# Patient Record
Sex: Male | Born: 1975
Health system: Southern US, Community
[De-identification: ages and names within clinical notes are randomized; demographics above are authoritative.]

## PROBLEM LIST (undated history)

## (undated) DIAGNOSIS — A6000 Herpesviral infection of urogenital system, unspecified: Secondary | ICD-10-CM

## (undated) DIAGNOSIS — I472 Ventricular tachycardia, unspecified: Secondary | ICD-10-CM

## (undated) DIAGNOSIS — I998 Other disorder of circulatory system: Secondary | ICD-10-CM

## (undated) DIAGNOSIS — I499 Cardiac arrhythmia, unspecified: Secondary | ICD-10-CM

## (undated) DIAGNOSIS — R55 Syncope and collapse: Secondary | ICD-10-CM

## (undated) DIAGNOSIS — B349 Viral infection, unspecified: Secondary | ICD-10-CM

## (undated) DIAGNOSIS — R12 Heartburn: Secondary | ICD-10-CM

## (undated) DIAGNOSIS — R000 Tachycardia, unspecified: Secondary | ICD-10-CM

## (undated) DIAGNOSIS — K219 Gastro-esophageal reflux disease without esophagitis: Secondary | ICD-10-CM

## (undated) DIAGNOSIS — R002 Palpitations: Secondary | ICD-10-CM

## (undated) DIAGNOSIS — I493 Ventricular premature depolarization: Secondary | ICD-10-CM

## (undated) DIAGNOSIS — J309 Allergic rhinitis, unspecified: Secondary | ICD-10-CM

## (undated) DIAGNOSIS — R001 Bradycardia, unspecified: Secondary | ICD-10-CM

## (undated) DIAGNOSIS — F419 Anxiety disorder, unspecified: Secondary | ICD-10-CM

## (undated) DIAGNOSIS — F329 Major depressive disorder, single episode, unspecified: Secondary | ICD-10-CM

## (undated) DIAGNOSIS — M549 Dorsalgia, unspecified: Secondary | ICD-10-CM

## (undated) DIAGNOSIS — R072 Precordial pain: Secondary | ICD-10-CM

## (undated) DIAGNOSIS — F3289 Other specified depressive episodes: Secondary | ICD-10-CM

## (undated) HISTORY — DX: Herpesviral infection of urogenital system, unspecified: A60.00

## (undated) HISTORY — DX: Allergic rhinitis, unspecified: J30.9

## (undated) HISTORY — DX: Anxiety disorder, unspecified: F41.9

## (undated) HISTORY — DX: Other disorder of circulatory system: I99.8

## (undated) HISTORY — DX: Cardiac arrhythmia, unspecified: I49.9

## (undated) HISTORY — DX: Ventricular tachycardia, unspecified: I47.20

## (undated) HISTORY — DX: Bradycardia, unspecified: R00.1

## (undated) HISTORY — DX: Heartburn: R12

## (undated) HISTORY — DX: Syncope and collapse: R55

## (undated) HISTORY — DX: Dorsalgia, unspecified: M54.9

## (undated) HISTORY — DX: Ventricular premature depolarization: I49.3

## (undated) HISTORY — DX: Palpitations: R00.2

## (undated) HISTORY — PX: SHOULDER SURGERY: SHX246

## (undated) HISTORY — DX: Other specified depressive episodes: F32.89

## (undated) HISTORY — DX: Major depressive disorder, single episode, unspecified: F32.9

## (undated) HISTORY — DX: Viral infection, unspecified: B34.9

## (undated) HISTORY — DX: Tachycardia, unspecified: R00.0

## (undated) HISTORY — DX: Gastro-esophageal reflux disease without esophagitis: K21.9

## (undated) HISTORY — DX: Precordial pain: R07.2

## (undated) HISTORY — DX: Ventricular tachycardia: I47.2

---

## 2002-09-02 ENCOUNTER — Encounter: Admission: RE | Admit: 2002-09-02 | Discharge: 2002-12-01 | Payer: Self-pay | Admitting: *Deleted

## 2003-01-06 ENCOUNTER — Encounter: Admission: RE | Admit: 2003-01-06 | Discharge: 2003-01-06 | Payer: Self-pay | Admitting: Specialist

## 2003-01-06 ENCOUNTER — Encounter: Payer: Self-pay | Admitting: Specialist

## 2003-01-06 ENCOUNTER — Encounter: Payer: Self-pay | Admitting: Diagnostic Radiology

## 2004-06-13 ENCOUNTER — Encounter: Admission: RE | Admit: 2004-06-13 | Discharge: 2004-06-13 | Payer: Self-pay | Admitting: Family Medicine

## 2007-01-06 ENCOUNTER — Encounter: Admission: RE | Admit: 2007-01-06 | Discharge: 2007-01-06 | Payer: Self-pay | Admitting: Family Medicine

## 2008-04-12 ENCOUNTER — Emergency Department (HOSPITAL_COMMUNITY): Admission: EM | Admit: 2008-04-12 | Discharge: 2008-04-12 | Payer: Self-pay | Admitting: Emergency Medicine

## 2011-02-15 LAB — DIFFERENTIAL
Basophils Absolute: 0 10*3/uL (ref 0.0–0.1)
Lymphocytes Relative: 21 % (ref 12–46)
Lymphs Abs: 1.6 10*3/uL (ref 0.7–4.0)
Neutrophils Relative %: 71 % (ref 43–77)

## 2011-02-15 LAB — URINALYSIS, ROUTINE W REFLEX MICROSCOPIC
Glucose, UA: NEGATIVE mg/dL
Nitrite: NEGATIVE
Protein, ur: NEGATIVE mg/dL
pH: 6 (ref 5.0–8.0)

## 2011-02-15 LAB — COMPREHENSIVE METABOLIC PANEL
BUN: 18 mg/dL (ref 6–23)
CO2: 28 mEq/L (ref 19–32)
Calcium: 9.5 mg/dL (ref 8.4–10.5)
Creatinine, Ser: 1.06 mg/dL (ref 0.4–1.5)
GFR calc non Af Amer: >60 mL/min (ref >60)
Glucose, Bld: 97 mg/dL (ref 70–99)
Total Bilirubin: 0.6 mg/dL (ref 0.3–1.2)

## 2011-02-15 LAB — CBC
HCT: 47.5 % (ref 39.0–52.0)
Hemoglobin: 16.1 g/dL (ref 13.0–17.0)
MCHC: 34 g/dL (ref 30.0–36.0)
MCV: 84.8 fL (ref 78.0–100.0)
RBC: 5.61 MIL/uL (ref 4.22–5.81)

## 2012-04-20 ENCOUNTER — Encounter: Payer: Self-pay | Admitting: Internal Medicine

## 2012-04-22 ENCOUNTER — Encounter: Payer: Self-pay | Admitting: Internal Medicine

## 2012-06-08 ENCOUNTER — Encounter: Payer: Self-pay | Admitting: Internal Medicine

## 2012-06-08 ENCOUNTER — Ambulatory Visit (INDEPENDENT_AMBULATORY_CARE_PROVIDER_SITE_OTHER): Payer: BC Managed Care – PPO | Admitting: Internal Medicine

## 2012-06-08 VITALS — BP 138/90 | HR 82 | Ht 73.0 in | Wt 205.0 lb

## 2012-06-08 DIAGNOSIS — R55 Syncope and collapse: Secondary | ICD-10-CM

## 2012-06-08 NOTE — Patient Instructions (Signed)
Your physician recommends that you schedule a follow-up appointment as needed  

## 2012-06-08 NOTE — Progress Notes (Signed)
Primary Care Physician: Deboraha Sprang Referring Physician:  Dr Lucky Rathke is a 37 y.o. male who presents today for EP consultation regarding syncope.  He has had several episodes of prior syncope.  These typically occur when upright and with low level exercise.  His first episode occurred in 2009.  He reports that he was [place] brisk[place] ferry.  He had abrupt syncope for only several seconds and then quickly returned to baseline. He reports that he has had "a dozen" similar episodes.  He does not always have full syncope with these events.  The episodes typically last 15-20 seconds and are proceeded by a prodrome of chest tightness with a slow "pounding" of his heart.  He denies diaphoresis, palor, tachypalpitations, or other symptoms.  He recovers and immediatly returns to normal after the episode.  Today, he denies symptoms of palpitations, chest pain, shortness of breath, orthopnea, PND, lower extremity edema, or neurologic sequela. The patient is tolerating medications without difficulties and is otherwise without complaint today.   Past Medical History  Diagnosis Date  . Anxiety   . Palpitations   . Syncope   . Back pain   . Genital HSV    Past Surgical History  Procedure Date  . Shoulder surgery     right    Current Outpatient Prescriptions  Medication Sig Dispense Refill  . ALPRAZolam (XANAX) 0.5 MG tablet Take 0.5 mg by mouth at bedtime as needed.      Marland Kitchen escitalopram (LEXAPRO) 20 MG tablet       . finasteride (PROPECIA) 1 MG tablet Take 1 mg by mouth daily.      . Omeprazole Magnesium (PRILOSEC OTC PO) Take by mouth.      Marland Kitchen PHENTERMINE HCL PO Take by mouth.      . valACYclovir (VALTREX) 500 MG tablet         No Known Allergies  History   Social History  . Marital Status: Married    Spouse Name: N/A    Number of Children: N/A  . Years of Education: N/A   Occupational History  . Not on file.   Social History Main Topics  . Smoking status: Never  Smoker   . Smokeless tobacco: Not on file  . Alcohol Use: Yes     Comment: 1-2 mixed drinks per day  . Drug Use: No  . Sexually Active: Not on file   Other Topics Concern  . Not on file   Social History Narrative   Lives in [place].  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 CAD      paternal uncle died suddenly, per pt autopsy confirmed MI    ROS- All systems are reviewed and negative except as per the HPI above  Physical Exam: Filed Vitals:   06/08/12 1525  BP: 138/90  Pulse: 82  Height: 6\' 1"  (1.854 m)  Weight: 205 lb (92.987 kg)    GEN- The patient is well appearing, alert and oriented x 3 today.   Head- normocephalic, atraumatic Eyes-  Sclera clear, conjunctiva pink Ears- hearing intact Oropharynx- clear Neck- supple, no JVP Lymph- no cervical lymphadenopathy Lungs- Clear to ausculation bilaterally, normal work of breathing Heart- Regular rate and rhythm, no murmurs, rubs or gallops, PMI not laterally displaced GI- soft, NT, ND, + BS Extremities- no clubbing, cyanosis, or edema MS- no significant deformity or atrophy Skin- no rash or lesion  Psych- euthymic mood, full affect Neuro- strength and sensation are intact  EKG 04/24/12- sinus rhythm 76 bpm, PR 198, QRS 96, Qtc 384, mild peaking of t waves, otherwise normal ekg Echo 04/29/12 reviewed holter and event monitor are reviewed  Assessment and Plan:

## 2012-06-21 DIAGNOSIS — R55 Syncope and collapse: Secondary | ICD-10-CM | POA: Insufficient documentation

## 2012-06-21 NOTE — Assessment & Plan Note (Signed)
The patient has had recurrent episodes of syncope.  He has a low risk ekg and echo and denies FH of sudden death.  He had nonsustained VT captured on a holter monitor during which he was asymptomatic.  He then later had presyncopal symptoms consistent with his prior episodes while wearing an event monitor.  This captured abrupt sinus bradycardia.  The monitor was otherwise unrevealing except for occasional PVCs not closely coupled to the preceding t wave.  No further NSVT was observed while wearing his monitor. I think that his syncopal events are likely neurocardiogenic in etiology.  Cardioinhibitory response was captured on event monitor.  He likely has vasomotor effects with these episodes also.  At this point, I would recommend conservative lifestyle modification including avoidance of triggers and assuming a recumbent position should symptoms occur. If episodes worsen then we could consider implantable loop recorder placement or if further episodes of bradycardia present possibly even a pacemaker.  He is clear that he would like to avoid further procedures if possible and would prefer a "watchful waiting" approach.  He will follow with Dr [name] and I will see as needed going forward.

## 2012-06-24 ENCOUNTER — Institutional Professional Consult (permissible substitution): Payer: BC Managed Care – PPO | Admitting: Internal Medicine

## 2012-06-30 ENCOUNTER — Encounter: Payer: Self-pay | Admitting: Internal Medicine

## 2012-09-14 ENCOUNTER — Encounter: Payer: Self-pay | Admitting: Internal Medicine

## 2013-04-22 ENCOUNTER — Other Ambulatory Visit: Payer: Self-pay | Admitting: Gastroenterology

## 2013-04-22 DIAGNOSIS — R1011 Right upper quadrant pain: Secondary | ICD-10-CM

## 2013-04-22 DIAGNOSIS — R072 Precordial pain: Secondary | ICD-10-CM

## 2013-04-22 HISTORY — DX: Precordial pain: R07.2

## 2013-04-26 ENCOUNTER — Ambulatory Visit
Admission: RE | Admit: 2013-04-26 | Discharge: 2013-04-26 | Disposition: A | Discharge status: Home / Self Care | Payer: BC Managed Care – PPO | Source: Ambulatory Visit | Attending: Gastroenterology | Admitting: Gastroenterology

## 2013-04-26 DIAGNOSIS — R1011 Right upper quadrant pain: Secondary | ICD-10-CM

## 2013-04-27 ENCOUNTER — Encounter: Payer: Self-pay | Admitting: Internal Medicine

## 2013-04-28 ENCOUNTER — Ambulatory Visit (INDEPENDENT_AMBULATORY_CARE_PROVIDER_SITE_OTHER): Payer: BC Managed Care – PPO | Admitting: Internal Medicine

## 2013-04-28 ENCOUNTER — Encounter: Payer: Self-pay | Admitting: Internal Medicine

## 2013-04-28 VITALS — BP 128/82 | HR 78 | Ht 73.0 in | Wt 214.0 lb

## 2013-04-28 DIAGNOSIS — I4729 Other ventricular tachycardia: Secondary | ICD-10-CM

## 2013-04-28 DIAGNOSIS — I472 Ventricular tachycardia, unspecified: Secondary | ICD-10-CM

## 2013-04-28 DIAGNOSIS — R55 Syncope and collapse: Secondary | ICD-10-CM

## 2013-04-28 NOTE — Patient Instructions (Signed)
Your physician recommends that you schedule a follow-up appointment in: 2 weeks with Dr [name]  Your physician has requested that you have a cardiac MRI. Cardiac MRI uses a computer to create images of your heart as its beating, producing both still and moving pictures of your heart and major blood vessels. For further information please visit [email or web address]. Please follow the instruction sheet given to you today for more information.  Your physician has requested that you have an echocardiogram. Echocardiography is a painless test that uses sound waves to create images of your heart. It provides your doctor with information about the size and shape of your heart and how well your heart's chambers and valves are working. This procedure takes approximately one hour. There are no restrictions for this procedure.  NO Driving

## 2013-04-29 NOTE — Progress Notes (Signed)
PCP:  Deboraha Sprang Primary Cardiologist: Pernell Lenoir is a 37 y.o. male who presents today for EP follow-up regarding syncope.  He has continued to have episodes of syncope and pre-syncope associated with light levels of exertion.  His father has recently been diagnosed with HCM after having a VT arrest and subsequently had an ICD implanted.   Last echocardiogram in Dec 2013 demonstrated an EF of 65-70%, no RWMA, IVSD of 1.4.  He has previously worn a holter monitor which demonstrated PVC's, NSVT, as well as abrupt episodes of bradycardia (likely neurocardiogenic).  The patient presents today for routine electrophysiology followup.  Since last being seen in our clinic, the patient reports doing very well.  Today, he denies symptoms of palpitations, chest pain, shortness of breath, orthopnea, PND, lower extremity edema, or neurologic sequela.  The patient feels that he is tolerating medications without difficulties and is otherwise without complaint today.   Past Medical History  Diagnosis Date  . Anxiety   . Palpitations   . Syncope   . Back pain     without radiation  . Genital HSV   . Tachycardia, unspecified   . Syncope and collapse   . Depressive disorder, not elsewhere classified   . Other specified circulatory system disorders   . Allergic rhinitis, cause unspecified   . Viral syndrome   . Abnormal heart rhythm   . Heartburn     Long Hx  . Substernal chest pain 04/22/13    Pt reports having vague symptoms of substernal CP, different from heartburn in that it is dull & radiatesto the back. Occurring at least 3 episodes over past several weeks. Pain free in between episodes. (04/22/13 Dr. Carman Ching GI)   . Esophageal reflux     Chronic but his current pain appears to be different than his radiating to the back. Possible biliary disease. (04/22/13 Dr. Carman Ching, GI)  . Wide QRS ventricular tachycardia     Noted on event monitor (04/20/12-05/20/12) at night  .  Bradycardia     Noted on event monitor (04/20/12-05/20/12) Just before bradycardia was recorded, HR was approx. 130bpm  . Near syncope     While wearing event monitor (04/20/12-05/20/12) 46bpm. Also had chest tightness at this time. Possible vagal ([place] 05/22/12)  . PVC (premature ventricular contraction)     Noted on event monitor (04/20/12-05/20/12)    Past Surgical History  Procedure Laterality Date  . Shoulder surgery Right     Dislocated    Current Outpatient Prescriptions  Medication Sig Dispense Refill  . ALPRAZolam (XANAX) 0.5 MG tablet Take 0.5 mg by mouth as needed.       Marland Kitchen escitalopram (LEXAPRO) 20 MG tablet Take 20 mg by mouth daily.       . Omeprazole Magnesium (PRILOSEC OTC PO) Take 1 tablet by mouth 2 (two) times daily.       . valACYclovir (VALTREX) 500 MG tablet Take 500 mg by mouth daily.        No current facility-administered medications for this visit.    No Known Allergies  History   Social History  . Marital Status: Married    Spouse Name: N/A    Number of Children: N/A  . Years of Education: N/A   Occupational History  . Not on file.   Social History Main Topics  . Smoking status: Never Smoker   . Smokeless tobacco: Not on file  . Alcohol Use: Yes     Comment: 1-2 mixed drinks  per day  . Drug Use: No  . Sexual Activity: Not on file   Other Topics Concern  . Not on file   Social History Narrative   Lives in [place].  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 CAD      paternal uncle died suddenly, per pt autopsy confirmed MI  . Breast cancer Mother 57  . Hiatal hernia Father   . Cardiomyopathy Father     Hypertrophic cardiomyopathy HOCM  . Anxiety disorder Sister   . Heart disease Paternal Uncle     early 39s  . Cardiomyopathy Paternal Uncle     Hypertrophic cardiomyopathy HOCM  . Diabetes Paternal Grandmother   . Diabetes Paternal Grandfather   . Cardiomyopathy Paternal  Grandfather     Hypertrophic cardiomyopathy HOCM    ROS-  All systems are reviewed and are negative except as outlined in the HPI above  Physical Exam: Filed Vitals:   04/28/13 1640  BP: 128/82  Pulse: 78  Height: 6\' 1"  (1.854 m)  Weight: 214 lb (97.07 kg)    GEN- The patient is well appearing, alert and oriented x 3 today.   Head- normocephalic, atraumatic Eyes-  Sclera clear, conjunctiva pink Ears- hearing intact Oropharynx- clear Neck- supple, no JVP Lymph- no cervical lymphadenopathy Lungs- Clear to ausculation bilaterally, normal work of breathing Heart- Regular rate and rhythm, I/VI SEM LUSB which increases to 3/6 with valsalva GI- soft, NT, ND, + BS Extremities- no clubbing, cyanosis, or edema MS- no significant deformity or atrophy Skin- no rash or lesion Psych- euthymic mood, full affect Neuro- strength and sensation are intact  EKG today is reviewed and reveals sinus rhythm with left posterior fascicular block pattern, nonspecific ST/T changes  Assessment and Plan:  1. Syncope Unclear etiology, though at times vagal by history, not at other times I have reminded him of DMVs restriction on driving x 6 months following syncope. I think that given his family history of sudden death and his father's diagnosis of HCM with VT, a more thorough look at this patient is advised. I will repeat an echo at this time as well as a cardiac MRI to evaluate for HCM.  His father has planned to proceed with genetic testing and we will offer this to [place] also if his father is found to have a mutation.  2. Murmur Concerning for dynamic outflow tract obstruction Will perform valsalva during echo Cardiac MRI to evaluate for HCM Genetic testing as above  Consider ILR placement if above workup is negative Return in 2 weeks for additional discussion

## 2013-05-07 ENCOUNTER — Encounter: Payer: Self-pay | Admitting: Internal Medicine

## 2013-05-10 ENCOUNTER — Other Ambulatory Visit (HOSPITAL_COMMUNITY): Payer: BC Managed Care – PPO

## 2013-05-12 ENCOUNTER — Encounter: Payer: Self-pay | Admitting: Internal Medicine

## 2013-05-12 ENCOUNTER — Ambulatory Visit: Payer: BC Managed Care – PPO | Admitting: Internal Medicine

## 2013-05-27 ENCOUNTER — Encounter (HOSPITAL_COMMUNITY): Payer: Self-pay | Admitting: Emergency Medicine

## 2013-05-27 ENCOUNTER — Other Ambulatory Visit (HOSPITAL_COMMUNITY): Payer: BC Managed Care – PPO

## 2013-05-27 ENCOUNTER — Emergency Department (HOSPITAL_COMMUNITY)
Admission: EM | Admit: 2013-05-27 | Discharge: 2013-05-27 | Disposition: A | Discharge status: Home / Self Care | Payer: BC Managed Care – PPO | Attending: Emergency Medicine | Admitting: Emergency Medicine

## 2013-05-27 DIAGNOSIS — Z8619 Personal history of other infectious and parasitic diseases: Secondary | ICD-10-CM | POA: Insufficient documentation

## 2013-05-27 DIAGNOSIS — F3289 Other specified depressive episodes: Secondary | ICD-10-CM | POA: Insufficient documentation

## 2013-05-27 DIAGNOSIS — K219 Gastro-esophageal reflux disease without esophagitis: Secondary | ICD-10-CM | POA: Insufficient documentation

## 2013-05-27 DIAGNOSIS — K5289 Other specified noninfective gastroenteritis and colitis: Secondary | ICD-10-CM | POA: Insufficient documentation

## 2013-05-27 DIAGNOSIS — K529 Noninfective gastroenteritis and colitis, unspecified: Secondary | ICD-10-CM

## 2013-05-27 DIAGNOSIS — Z8679 Personal history of other diseases of the circulatory system: Secondary | ICD-10-CM | POA: Insufficient documentation

## 2013-05-27 DIAGNOSIS — F329 Major depressive disorder, single episode, unspecified: Secondary | ICD-10-CM | POA: Insufficient documentation

## 2013-05-27 DIAGNOSIS — Z79899 Other long term (current) drug therapy: Secondary | ICD-10-CM | POA: Insufficient documentation

## 2013-05-27 DIAGNOSIS — F411 Generalized anxiety disorder: Secondary | ICD-10-CM | POA: Insufficient documentation

## 2013-05-27 LAB — CBC WITH DIFFERENTIAL/PLATELET
Basophils Absolute: 0 10*3/uL (ref 0.0–0.1)
Basophils Relative: 0 % (ref 0–1)
EOS ABS: 0.1 10*3/uL (ref 0.0–0.7)
EOS PCT: 1 % (ref 0–5)
HCT: 48.7 % (ref 39.0–52.0)
HEMOGLOBIN: 17 g/dL (ref 13.0–17.0)
LYMPHS ABS: 0.6 10*3/uL — AB (ref 0.7–4.0)
Lymphocytes Relative: 5 % — ABNORMAL LOW (ref 12–46)
MCH: 29.6 pg (ref 26.0–34.0)
MCHC: 34.9 g/dL (ref 30.0–36.0)
MCV: 84.8 fL (ref 78.0–100.0)
MONOS PCT: 5 % (ref 3–12)
Monocytes Absolute: 0.7 10*3/uL (ref 0.1–1.0)
Neutro Abs: 12 10*3/uL — ABNORMAL HIGH (ref 1.7–7.7)
Neutrophils Relative %: 90 % — ABNORMAL HIGH (ref 43–77)
PLATELETS: 269 10*3/uL (ref 150–400)
RBC: 5.74 MIL/uL (ref 4.22–5.81)
RDW: 13.3 % (ref 11.5–15.5)
WBC: 13.4 10*3/uL — ABNORMAL HIGH (ref 4.0–10.5)

## 2013-05-27 LAB — COMPREHENSIVE METABOLIC PANEL
ALT: 120 U/L — ABNORMAL HIGH (ref 0–53)
AST: 49 U/L — ABNORMAL HIGH (ref 0–37)
Albumin: 5 g/dL (ref 3.5–5.2)
Alkaline Phosphatase: 113 U/L (ref 39–117)
BUN: 17 mg/dL (ref 6–23)
CALCIUM: 9.8 mg/dL (ref 8.4–10.5)
CO2: 25 mEq/L (ref 19–32)
Chloride: 100 mEq/L (ref 96–112)
Creatinine, Ser: 1.01 mg/dL (ref 0.50–1.35)
GFR calc non Af Amer: >90 mL/min (ref >90)
GLUCOSE: 155 mg/dL — AB (ref 70–99)
Potassium: 4.2 mEq/L (ref 3.7–5.3)
Sodium: 143 mEq/L (ref 137–147)
TOTAL PROTEIN: 8.4 g/dL — AB (ref 6.0–8.3)
Total Bilirubin: 0.4 mg/dL (ref 0.3–1.2)

## 2013-05-27 MED ORDER — ONDANSETRON 4 MG PO TBDP
8.0000 mg | ORAL_TABLET | Freq: Once | ORAL | Status: AC
  Administered 2013-05-27: 8 mg via ORAL
  Filled 2013-05-27: qty 2

## 2013-05-27 MED ORDER — ONDANSETRON HCL 4 MG/2ML IJ SOLN
4.0000 mg | Freq: Once | INTRAMUSCULAR | Status: AC
  Administered 2013-05-27: 4 mg via INTRAVENOUS
  Filled 2013-05-27: qty 2

## 2013-05-27 MED ORDER — SODIUM CHLORIDE 0.9 % IV BOLUS (SEPSIS)
1000.0000 mL | Freq: Once | INTRAVENOUS | Status: AC
  Administered 2013-05-27: 1000 mL via INTRAVENOUS

## 2013-05-27 MED ORDER — ONDANSETRON 4 MG PO TBDP: ORAL_TABLET | ORAL | Status: DC

## 2013-05-27 NOTE — Discharge Instructions (Signed)
Viral Gastroenteritis Viral gastroenteritis is also known as stomach flu. This condition affects the stomach and intestinal tract. It can cause sudden diarrhea and vomiting. The illness typically lasts 3 to 8 days. Most people develop an immune response that eventually gets rid of the virus. While this natural response develops, the virus can make you quite ill. CAUSES  Many different viruses can cause gastroenteritis, such as rotavirus or noroviruses. You can catch one of these viruses by consuming contaminated food or water. You may also catch a virus by sharing utensils or other personal items with an infected person or by touching a contaminated surface. SYMPTOMS  The most common symptoms are diarrhea and vomiting. These problems can cause a severe loss of body fluids (dehydration) and a body salt (electrolyte) imbalance. Other symptoms may include:  Fever.  Headache.  Fatigue.  Abdominal pain. DIAGNOSIS  Your caregiver can usually diagnose viral gastroenteritis based on your symptoms and a physical exam. A stool sample may also be taken to test for the presence of viruses or other infections. TREATMENT  This illness typically goes away on its own. Treatments are aimed at rehydration. The most serious cases of viral gastroenteritis involve vomiting so severely that you are not able to keep fluids down. In these cases, fluids must be given through an intravenous line (IV). HOME CARE INSTRUCTIONS   Drink enough fluids to keep your urine clear or pale yellow. Drink small amounts of fluids frequently and increase the amounts as tolerated.  Ask your caregiver for specific rehydration instructions.  Avoid:  Foods high in sugar.  Alcohol.  Carbonated drinks.  Tobacco.  Juice.  Caffeine drinks.  Extremely hot or cold fluids.  Fatty, greasy foods.  Too much intake of anything at one time.  Dairy products until 24 to 48 hours after diarrhea stops.  You may consume probiotics.  Probiotics are active cultures of beneficial bacteria. They may lessen the amount and number of diarrheal stools in adults. Probiotics can be found in yogurt with active cultures and in supplements.  Wash your hands well to avoid spreading the virus.  Only take over-the-counter or prescription medicines for pain, discomfort, or fever as directed by your caregiver. Do not give aspirin to children. Antidiarrheal medicines are not recommended.  Ask your caregiver if you should continue to take your regular prescribed and over-the-counter medicines.  Keep all follow-up appointments as directed by your caregiver. SEEK IMMEDIATE MEDICAL CARE IF:   You are unable to keep fluids down.  You do not urinate at least once every 6 to 8 hours.  You develop shortness of breath.  You notice blood in your stool or vomit. This may look like coffee grounds.  You have abdominal pain that increases or is concentrated in one small area (localized).  You have persistent vomiting or diarrhea.  You have a fever.  The patient is a child younger than 3 months, and he or she has a fever.  The patient is a child older than 3 months, and he or she has a fever and persistent symptoms.  The patient is a child older than 3 months, and he or she has a fever and symptoms suddenly get worse.  The patient is a baby, and he or she has no tears when crying. MAKE SURE YOU:   Understand these instructions.  Will watch your condition.  Will get help right away if you are not doing well or get worse. Document Released: 04/29/2005 Document Revised: 07/22/2011 Document Reviewed: 02/13/2011   [employer] Patient Information 2014 [employer]  

## 2013-05-27 NOTE — ED Notes (Signed)
Pt. reports nausea, emesis and diarrhea onset 10 pm last night , denies fever or chills, no cough or congestion .

## 2013-05-27 NOTE — ED Provider Notes (Signed)
CSN: [id number]     Arrival date & time 05/27/13  0345 History   First MD Initiated Contact with Patient 05/27/13 [id number]     Chief Complaint  Patient presents with  . Emesis  . Diarrhea   (Consider location/radiation/quality/duration/timing/severity/associated sxs/prior Treatment) HPI Patient began not feeling well around 10 PM last evening. He then had multiple episodes of vomiting and diarrhea throughout the night. He states he probably had roughly 6 episodes each. There's been no blood or bile in neither the vomit or the stool. He's had no fevers or chills. He has mild abdominal pain after multiple episodes of vomiting but had no initial abdominal pain. He is employed he they called out of work yesterday for similar symptoms. He last ate dinner around 6 PM and states he had a salad. No recent international travel. Vomiting is improved with by mouth Zofran.  Past Medical History  Diagnosis Date  . Anxiety   . Palpitations   . Syncope   . Back pain     without radiation  . Genital HSV   . Tachycardia, unspecified   . Syncope and collapse   . Depressive disorder, not elsewhere classified   . Other specified circulatory system disorders   . Allergic rhinitis, cause unspecified   . Viral syndrome   . Abnormal heart rhythm   . Heartburn     Long Hx  . Substernal chest pain 04/22/13    Pt reports having vague symptoms of substernal CP, different from heartburn in that it is dull & radiatesto the back. Occurring at least 3 episodes over past several weeks. Pain free in between episodes. (04/22/13 Dr. Carman [facility])   . Esophageal reflux     Chronic but his current pain appears to be different than his radiating to the back. Possible biliary disease. (04/22/13 Dr. Carman ChingJames Edwards, GI)  . Wide QRS ventricular tachycardia     Noted on event monitor (04/20/12-05/20/12) at night  . Bradycardia     Noted on event monitor (04/20/12-05/20/12) Just before bradycardia was recorded, HR was approx.  130bpm  . Near syncope     While wearing event monitor (04/20/12-05/20/12) 46bpm. Also had chest tightness at this time. Possible vagal ([place] 05/22/12)  . PVC (premature ventricular contraction)     Noted on event monitor (04/20/12-05/20/12)    Past Surgical History  Procedure Laterality Date  . Shoulder surgery Right     Dislocated   Family History  Problem Relation Age of Onset  . CAD      paternal uncle died suddenly, per pt autopsy confirmed MI  . Breast cancer Mother [id number]  . Hiatal hernia Father   . Cardiomyopathy Father     Hypertrophic cardiomyopathy HOCM  . Anxiety disorder Sister   . Heart disease Paternal Uncle     early 3450s  . Cardiomyopathy Paternal Uncle     Hypertrophic cardiomyopathy HOCM  . Diabetes Paternal Grandmother   . Diabetes Paternal Grandfather   . Cardiomyopathy Paternal Grandfather     Hypertrophic cardiomyopathy HOCM   History  Substance Use Topics  . Smoking status: Never Smoker   . Smokeless tobacco: Not on file  . Alcohol Use: Yes     Comment: 1-2 mixed drinks per day    Review of Systems  Constitutional: Negative for fever and chills.  Respiratory: Negative for shortness of breath.   Cardiovascular: Negative for chest pain.  Gastrointestinal: Positive for nausea, vomiting, abdominal pain and diarrhea. Negative for constipation and blood in  stool.  Genitourinary: Negative for dysuria and frequency.  Musculoskeletal: Negative for back pain, neck pain and neck stiffness.  Skin: Negative for rash and wound.  Neurological: Negative for dizziness, weakness, light-headedness, numbness and headaches.  All other systems reviewed and are negative.    Allergies  Review of patient's allergies indicates no known allergies.  Home Medications   Current Outpatient Rx  Name  Route  Sig  Dispense  Refill  . ALPRAZolam (XANAX) 0.5 MG tablet   Oral   Take 0.5 mg by mouth as needed.          Marland Kitchen escitalopram (LEXAPRO) 20 MG tablet   Oral   Take  20 mg by mouth daily.          . Omeprazole Magnesium (PRILOSEC OTC PO)   Oral   Take 1 tablet by mouth 2 (two) times daily.          . valACYclovir (VALTREX) 500 MG tablet   Oral   Take 500 mg by mouth daily.           BP 138/102  Pulse 110  Temp(Src) 98 F (36.7 C) (Oral)  Resp 16  SpO2 97% Physical Exam  Nursing note and vitals reviewed. Constitutional: He is oriented to person, place, and time. He appears well-developed and well-nourished. No distress.  HENT:  Head: Normocephalic and atraumatic.  Mouth/Throat: Oropharynx is clear and moist. No oropharyngeal exudate.  Eyes: EOM are normal. Pupils are equal, round, and reactive to light.  Neck: Normal range of motion. Neck supple.  Cardiovascular: Normal rate and regular rhythm.   Pulmonary/Chest: Effort normal and breath sounds normal. No respiratory distress. He has no wheezes. He has no rales. He exhibits no tenderness.  Abdominal: Soft. Bowel sounds are normal. He exhibits no distension and no mass. There is no tenderness. There is no rebound and no guarding.  Mildly hyperactive bowel sounds  Musculoskeletal: Normal range of motion. He exhibits no edema and no tenderness.  Neurological: He is alert and oriented to person, place, and time.  Moves all extremities without deficit. Sensation grossly intact.  Skin: Skin is warm and dry. No rash noted. No erythema.  Psychiatric: He has a normal mood and affect. His behavior is normal.    ED Course  Procedures (including critical care time) Labs Review Labs Reviewed  CBC WITH DIFFERENTIAL  COMPREHENSIVE METABOLIC PANEL   Imaging Review No results found.  EKG Interpretation   None       MDM   Will check basic electrolytes and rehydrate. Anticipate discharge. Question viral gastroenteritis versus food poisoning. Abdominal exam is benign and I have low suspicion for surgical cause for patient's symptoms. I do not believe imaging is necessary at this  point.  Patient's is his feeling better with the IV fluids and Zofran. No further vomiting in the emergency department. Abdomen remained soft and nontender    Loren Racer, MD 05/27/13 [phone]

## 2013-06-09 ENCOUNTER — Other Ambulatory Visit (HOSPITAL_COMMUNITY): Payer: BC Managed Care – PPO

## 2013-06-09 ENCOUNTER — Ambulatory Visit: Payer: BC Managed Care – PPO | Admitting: Internal Medicine

## 2013-06-10 ENCOUNTER — Ambulatory Visit (HOSPITAL_COMMUNITY): Admission: RE | Admit: 2013-06-10 | Payer: BC Managed Care – PPO | Source: Ambulatory Visit

## 2013-06-14 ENCOUNTER — Encounter: Payer: Self-pay | Admitting: Cardiovascular Disease

## 2013-06-14 ENCOUNTER — Ambulatory Visit (HOSPITAL_COMMUNITY): Payer: BC Managed Care – PPO | Attending: Internal Medicine | Admitting: Radiology

## 2013-06-14 DIAGNOSIS — R55 Syncope and collapse: Secondary | ICD-10-CM

## 2013-06-14 DIAGNOSIS — I4729 Other ventricular tachycardia: Secondary | ICD-10-CM | POA: Insufficient documentation

## 2013-06-14 DIAGNOSIS — I472 Ventricular tachycardia, unspecified: Secondary | ICD-10-CM | POA: Insufficient documentation

## 2013-06-14 NOTE — Progress Notes (Signed)
Echocardiogram performed.  

## 2013-06-16 ENCOUNTER — Telehealth: Payer: Self-pay | Admitting: Internal Medicine

## 2013-06-16 NOTE — Telephone Encounter (Signed)
New problem:  Pt is requesting a call back with test results

## 2013-06-17 ENCOUNTER — Other Ambulatory Visit (HOSPITAL_COMMUNITY): Payer: BC Managed Care – PPO

## 2013-06-17 NOTE — Telephone Encounter (Signed)
Left message with normal echo results

## 2013-06-18 ENCOUNTER — Ambulatory Visit (HOSPITAL_COMMUNITY)
Admission: RE | Admit: 2013-06-18 | Discharge: 2013-06-18 | Disposition: A | Discharge status: Home / Self Care | Payer: BC Managed Care – PPO | Source: Ambulatory Visit | Attending: Internal Medicine | Admitting: Internal Medicine

## 2013-06-18 DIAGNOSIS — I472 Ventricular tachycardia, unspecified: Secondary | ICD-10-CM

## 2013-06-18 DIAGNOSIS — R55 Syncope and collapse: Secondary | ICD-10-CM

## 2013-06-18 DIAGNOSIS — I517 Cardiomegaly: Secondary | ICD-10-CM | POA: Insufficient documentation

## 2013-06-18 DIAGNOSIS — I421 Obstructive hypertrophic cardiomyopathy: Secondary | ICD-10-CM

## 2013-06-18 MED ORDER — GADOBENATE DIMEGLUMINE 529 MG/ML IV SOLN
30.0000 mL | Freq: Once | INTRAVENOUS | Status: AC | PRN
  Administered 2013-06-18: 30 mL via INTRAVENOUS

## 2013-07-02 ENCOUNTER — Encounter: Payer: Self-pay | Admitting: Internal Medicine

## 2013-07-02 ENCOUNTER — Ambulatory Visit (INDEPENDENT_AMBULATORY_CARE_PROVIDER_SITE_OTHER): Payer: BC Managed Care – PPO | Admitting: Internal Medicine

## 2013-07-02 VITALS — BP 120/84 | HR 76 | Ht 73.0 in | Wt 210.0 lb

## 2013-07-02 DIAGNOSIS — R55 Syncope and collapse: Secondary | ICD-10-CM

## 2013-07-02 DIAGNOSIS — I422 Other hypertrophic cardiomyopathy: Secondary | ICD-10-CM

## 2013-07-02 NOTE — Patient Instructions (Addendum)
Your physician recommends that you schedule a follow-up appointment in: 4 weeks with Dr Johney FrameAllred  [facility]family history HCM/VT

## 2013-07-04 DIAGNOSIS — I422 Other hypertrophic cardiomyopathy: Secondary | ICD-10-CM | POA: Insufficient documentation

## 2013-07-04 NOTE — Progress Notes (Signed)
PCP:  David Booker Primary Cardiologist: David Booker is a 38 y.o. male who presents today for EP follow-up regarding syncope.  He has continued to have episodes of syncope and pre-syncope associated with light levels of exertion.  His father has recently been diagnosed with HCM after having a VT arrest and subsequently had an ICD implanted. Recent MRI has shown marked asymmetric hypertrophy of the mid septum at 20mm similar to his fathers pattern.  Last echocardiogram in 06/14/13 demonstrated an EF of60% with mid septal hypertrophy and no gradient at rest.  Unfortunately, valsalva does not appear to have been performed.  The patient presents today for routine electrophysiology followup.  Since last being seen in our clinic, the patient reports doing very well.  Today, he denies symptoms of palpitations, chest pain, shortness of breath, orthopnea, PND, lower extremity edema, or neurologic sequela.  The patient feels that he is tolerating medications without difficulties and is otherwise without complaint today.   Past Medical History  Diagnosis Date  . Anxiety   . Palpitations   . Syncope   . Back pain     without radiation  . Genital HSV   . Tachycardia, unspecified   . Syncope and collapse   . Depressive disorder, not elsewhere classified   . Other specified circulatory system disorders   . Allergic rhinitis, cause unspecified   . Viral syndrome   . Abnormal heart rhythm   . Heartburn     Long Hx  . Substernal chest pain 04/22/13    Pt reports having vague symptoms of substernal CP, different from heartburn in that it is dull & radiatesto the back. Occurring at least 3 episodes over past several weeks. Pain free in between episodes. (04/22/13 Dr. Carman Ching GI)   . Esophageal reflux     Chronic but his current pain appears to be different than his radiating to the back. Possible biliary disease. (04/22/13 Dr. Carman Ching, GI)  . Wide QRS ventricular tachycardia    Noted on event monitor (04/20/12-05/20/12) at night  . Bradycardia     Noted on event monitor (04/20/12-05/20/12) Just before bradycardia was recorded, HR was approx. 130bpm  . Near syncope     While wearing event monitor (04/20/12-05/20/12) 46bpm. Also had chest tightness at this time. Possible vagal (Skains 05/22/12)  . PVC (premature ventricular contraction)     Noted on event monitor (04/20/12-05/20/12)    Past Surgical History  Procedure Laterality Date  . Shoulder surgery Right     Dislocated    Current Outpatient Prescriptions  Medication Sig Dispense Refill  . ALPRAZolam (XANAX) 0.5 MG tablet Take 0.5 mg by mouth as needed.       Marland Kitchen escitalopram (LEXAPRO) 20 MG tablet Take 20 mg by mouth daily.       . Omeprazole Magnesium (PRILOSEC OTC PO) Take 1 tablet by mouth 2 (two) times daily.       . valACYclovir (VALTREX) 500 MG tablet Take 500 mg by mouth daily.        No current facility-administered medications for this visit.    No Known Allergies  History   Social History  . Marital Status: Married    Spouse Name: N/A    Number of Children: N/A  . Years of Education: N/A   Occupational History  . Not on file.   Social History Main Topics  . Smoking status: Never Smoker   . Smokeless tobacco: Not on file  . Alcohol Use: Yes  Comment: 1-2 mixed drinks per day  . Drug Use: No  . Sexual Activity: Not on file   Other Topics Concern  . Not on file   Social History Narrative   Lives in [place].  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  Married.  No children    Family History  Problem Relation Age of Onset  . CAD Paternal Uncle     paternal uncle died suddenly, per pt autopsy confirmed MI  . Breast cancer Mother [id number]  . Hiatal hernia Father   . Cardiomyopathy Father     Hypertrophic cardiomyopathy HOCM  . Anxiety disorder Sister   . Heart disease Paternal Uncle     Has a VAD and IDC  . Cardiomyopathy Paternal Uncle     Hypertrophic cardiomyopathy HOCM  . Diabetes Paternal  Grandmother     Hypertrophic cardiomyopathy  . Diabetes Paternal Grandfather   . Cardiomyopathy Paternal Grandfather     Hypertrophic cardiomyopathy HOCM  . Heart murmur Sister   . GER disease Father   . Supraventricular tachycardia Father   . Heart attack Paternal Uncle 51  . Diabetes Paternal Uncle   . Cardiomyopathy Cousin     hypertrophic cardiomyopathy  . Heart disease Cousin     mitral valve repair and heart ablation surgery  . Macular degeneration Maternal Grandmother   . Scoliosis Mother   . Osteoporosis Mother   . Marfan syndrome Maternal Aunt   . Scoliosis Maternal Aunt   . Mitral valve prolapse Maternal Aunt   . Mitral valve prolapse Maternal Uncle   . Other Maternal Uncle     connective tissue issues  . Mitral valve prolapse Cousin   . Other Cousin     thoracic output surgery  . Other Cousin     thoracic output surgery    ROS-  All systems are reviewed and are negative except as outlined in the HPI above  Physical Exam: Filed Vitals:   07/02/13 1518  BP: 120/84  Pulse: 76  Height: 6\' 1"  (1.854 m)  Weight: 210 lb (95.255 kg)    GEN- The patient is well appearing, alert and oriented x 3 today.   Head- normocephalic, atraumatic Eyes-  Sclera clear, conjunctiva pink Ears- hearing intact Oropharynx- clear Neck- supple, no JVP Lymph- no cervical lymphadenopathy Lungs- Clear to ausculation bilaterally, normal work of breathing Heart- Regular rate and rhythm, I/VI SEM LUSB which increases to 3/6 with valsalva GI- soft, NT, ND, + BS Extremities- no clubbing, cyanosis, or edema MS- no significant deformity or atrophy Skin- no rash or lesion Psych- euthymic mood, full affect Neuro- strength and sensation are intact  EKG today is reviewed and reveals sinus rhythm 76 bpm with poor R wave progression  Assessment and Plan:  1. Syncope/ hypertrophic cardiomyopahty  I have strongly advised ICD implantation at this time.  Given previous documentation of  bradycardic events in this patient, I would favor a traditional transvenous system rather than a subcutaneous system. At this point, he is very clear that he would like to wait and think about this more. I have therefore recommended that he be referred to [facility] Nolon [facility]Andrew [facility] at [facility] for [facility] second opinion and further discussion of treatment options in the hypertrophic CM clinic.  Perhaps Dr Regino SchultzeWang can assist with genetic testing at that time. I have spoken with Dr Regino SchultzeWang who will arrange for close follow-up in their HCM clinic.  Return in 4 weeks for further discussion If he decides to proceed with ICD in the interim, then  he should contact my office. I have reminded him of DMVs restriction on driving x 6 months following syncope.

## 2013-08-05 ENCOUNTER — Other Ambulatory Visit: Payer: Self-pay

## 2013-08-09 ENCOUNTER — Ambulatory Visit: Payer: BC Managed Care – PPO | Admitting: Internal Medicine

## 2013-08-09 DIAGNOSIS — R079 Chest pain, unspecified: Secondary | ICD-10-CM | POA: Insufficient documentation

## 2013-08-09 DIAGNOSIS — K219 Gastro-esophageal reflux disease without esophagitis: Secondary | ICD-10-CM | POA: Insufficient documentation

## 2013-08-09 DIAGNOSIS — I4729 Other ventricular tachycardia: Secondary | ICD-10-CM | POA: Insufficient documentation

## 2013-08-09 DIAGNOSIS — I472 Ventricular tachycardia: Secondary | ICD-10-CM | POA: Insufficient documentation

## 2013-09-10 ENCOUNTER — Telehealth: Payer: Self-pay | Admitting: Internal Medicine

## 2013-09-10 NOTE — Telephone Encounter (Signed)
FA is fine with patient. He is back in town next Friday. He will work with you.

## 2013-09-10 NOTE — Telephone Encounter (Signed)
Left message with dates that Dr Johney FrameAllred could implant his ICD.  Have asked she call me back as to what would work for them

## 2013-09-10 NOTE — Telephone Encounter (Signed)
New message          The doctor that dr allred referred the pt to for a 2nd opinion is out of their insurance network. Pt needs dr allred to do the surgery. Pt is leaving the country today at 5pm.

## 2013-09-17 ENCOUNTER — Telehealth: Payer: Self-pay | Admitting: *Deleted

## 2013-09-17 NOTE — Telephone Encounter (Signed)
LMTCB  (Need to inform patient ICD implant on 6/25 has been rescheduled to 3pm, be at hospital at 1 pm)

## 2013-09-24 NOTE — Telephone Encounter (Signed)
Follow up     Returned a nurses call regarding his upcoming procedure

## 2013-09-27 NOTE — Telephone Encounter (Signed)
Informed pt of rescheduled procedure time. Patient verbalized understanding and agreeable to plan.

## 2013-10-08 ENCOUNTER — Encounter: Payer: Self-pay | Admitting: *Deleted

## 2013-10-08 ENCOUNTER — Other Ambulatory Visit: Payer: Self-pay | Admitting: *Deleted

## 2013-10-08 DIAGNOSIS — I422 Other hypertrophic cardiomyopathy: Secondary | ICD-10-CM

## 2013-10-08 DIAGNOSIS — R55 Syncope and collapse: Secondary | ICD-10-CM

## 2013-10-28 ENCOUNTER — Other Ambulatory Visit (INDEPENDENT_AMBULATORY_CARE_PROVIDER_SITE_OTHER): Payer: BC Managed Care – PPO

## 2013-10-28 DIAGNOSIS — I422 Other hypertrophic cardiomyopathy: Secondary | ICD-10-CM

## 2013-10-28 DIAGNOSIS — R55 Syncope and collapse: Secondary | ICD-10-CM

## 2013-10-28 LAB — CBC WITH DIFFERENTIAL/PLATELET
BASOS PCT: 0.6 % (ref 0.0–3.0)
Basophils Absolute: 0 10*3/uL (ref 0.0–0.1)
EOS PCT: 2.4 % (ref 0.0–5.0)
Eosinophils Absolute: 0.2 10*3/uL (ref 0.0–0.7)
HEMATOCRIT: 42 % (ref 39.0–52.0)
Hemoglobin: 14.3 g/dL (ref 13.0–17.0)
Lymphocytes Relative: 30.8 % (ref 12.0–46.0)
Lymphs Abs: 2.2 10*3/uL (ref 0.7–4.0)
MCHC: 34 g/dL (ref 30.0–36.0)
MCV: 83.2 fl (ref 78.0–100.0)
MONO ABS: 0.6 10*3/uL (ref 0.1–1.0)
MONOS PCT: 7.6 % (ref 3.0–12.0)
NEUTROS PCT: 58.6 % (ref 43.0–77.0)
Neutro Abs: 4.2 10*3/uL (ref 1.4–7.7)
Platelets: 293 10*3/uL (ref 150.0–400.0)
RBC: 5.05 Mil/uL (ref 4.22–5.81)
RDW: 13.7 % (ref 11.5–15.5)
WBC: 7.3 10*3/uL (ref 4.0–10.5)

## 2013-10-28 LAB — BASIC METABOLIC PANEL
BUN: 17 mg/dL (ref 6–23)
CO2: 31 mEq/L (ref 19–32)
Calcium: 9.4 mg/dL (ref 8.4–10.5)
Chloride: 103 mEq/L (ref 96–112)
Creatinine, Ser: 0.9 mg/dL (ref 0.4–1.5)
GFR: 94.35 mL/min (ref >60.00)
Glucose, Bld: 99 mg/dL (ref 70–99)
POTASSIUM: 4.2 meq/L (ref 3.5–5.1)
Sodium: 139 mEq/L (ref 135–145)

## 2013-11-03 MED ORDER — SODIUM CHLORIDE 0.9 % IR SOLN
80.0000 mg | Status: DC
  Filled 2013-11-03: qty 2

## 2013-11-03 MED ORDER — CEFAZOLIN SODIUM-DEXTROSE 2-3 GM-% IV SOLR
2.0000 g | INTRAVENOUS | Status: DC
  Filled 2013-11-03: qty 50

## 2013-11-04 ENCOUNTER — Encounter (HOSPITAL_COMMUNITY)
Admission: RE | Disposition: A | Discharge status: Home / Self Care | Payer: Self-pay | Source: Ambulatory Visit | Attending: Internal Medicine

## 2013-11-04 ENCOUNTER — Encounter (HOSPITAL_COMMUNITY): Payer: Self-pay | Admitting: *Deleted

## 2013-11-04 ENCOUNTER — Ambulatory Visit (HOSPITAL_COMMUNITY)
Admission: RE | Admit: 2013-11-04 | Discharge: 2013-11-05 | Disposition: A | Discharge status: Home / Self Care | Payer: BC Managed Care – PPO | Source: Ambulatory Visit | Attending: Internal Medicine | Admitting: Internal Medicine

## 2013-11-04 DIAGNOSIS — I498 Other specified cardiac arrhythmias: Secondary | ICD-10-CM | POA: Insufficient documentation

## 2013-11-04 DIAGNOSIS — K219 Gastro-esophageal reflux disease without esophagitis: Secondary | ICD-10-CM | POA: Insufficient documentation

## 2013-11-04 DIAGNOSIS — I422 Other hypertrophic cardiomyopathy: Secondary | ICD-10-CM | POA: Diagnosis present

## 2013-11-04 DIAGNOSIS — R55 Syncope and collapse: Secondary | ICD-10-CM

## 2013-11-04 DIAGNOSIS — F329 Major depressive disorder, single episode, unspecified: Secondary | ICD-10-CM | POA: Insufficient documentation

## 2013-11-04 DIAGNOSIS — F3289 Other specified depressive episodes: Secondary | ICD-10-CM | POA: Insufficient documentation

## 2013-11-04 DIAGNOSIS — F411 Generalized anxiety disorder: Secondary | ICD-10-CM | POA: Insufficient documentation

## 2013-11-04 DIAGNOSIS — I421 Obstructive hypertrophic cardiomyopathy: Secondary | ICD-10-CM

## 2013-11-04 HISTORY — PX: IMPLANTABLE CARDIOVERTER DEFIBRILLATOR IMPLANT: SHX5473

## 2013-11-04 HISTORY — PX: CARDIAC DEFIBRILLATOR PLACEMENT: SHX171

## 2013-11-04 LAB — SURGICAL PCR SCREEN
MRSA, PCR: NEGATIVE
Staphylococcus aureus: NEGATIVE

## 2013-11-04 SURGERY — IMPLANTABLE CARDIOVERTER DEFIBRILLATOR IMPLANT
Anesthesia: LOCAL

## 2013-11-04 MED ORDER — LIDOCAINE HCL (PF) 1 % IJ SOLN
INTRAMUSCULAR | Status: AC
  Filled 2013-11-04: qty 60

## 2013-11-04 MED ORDER — MIDAZOLAM HCL 5 MG/5ML IJ SOLN
INTRAMUSCULAR | Status: AC
  Filled 2013-11-04: qty 5

## 2013-11-04 MED ORDER — FENTANYL CITRATE 0.05 MG/ML IJ SOLN
INTRAMUSCULAR | Status: AC
  Filled 2013-11-04: qty 2

## 2013-11-04 MED ORDER — ESCITALOPRAM OXALATE 20 MG PO TABS
20.0000 mg | ORAL_TABLET | Freq: Every day | ORAL | Status: DC
  Administered 2013-11-04 – 2013-11-05 (×2): 20 mg via ORAL
  Filled 2013-11-04 (×2): qty 1

## 2013-11-04 MED ORDER — MUPIROCIN 2 % EX OINT
TOPICAL_OINTMENT | Freq: Two times a day (BID) | CUTANEOUS | Status: DC
  Administered 2013-11-04: 1 via NASAL
  Filled 2013-11-04: qty 22

## 2013-11-04 MED ORDER — ACETAMINOPHEN 325 MG PO TABS: 325.0000 mg | ORAL_TABLET | ORAL | Status: DC | PRN

## 2013-11-04 MED ORDER — METOPROLOL SUCCINATE ER 25 MG PO TB24
25.0000 mg | ORAL_TABLET | Freq: Every day | ORAL | Status: DC
  Administered 2013-11-04 – 2013-11-05 (×2): 25 mg via ORAL
  Filled 2013-11-04 (×2): qty 1

## 2013-11-04 MED ORDER — YOU HAVE A PACEMAKER BOOK
Freq: Once | Status: AC
  Administered 2013-11-04: 20:00:00
  Filled 2013-11-04: qty 1

## 2013-11-04 MED ORDER — CHLORHEXIDINE GLUCONATE 4 % EX LIQD: 60.0000 mL | Freq: Once | CUTANEOUS | Status: DC

## 2013-11-04 MED ORDER — ONDANSETRON HCL 4 MG/2ML IJ SOLN: 4.0000 mg | Freq: Four times a day (QID) | INTRAMUSCULAR | Status: DC | PRN

## 2013-11-04 MED ORDER — SODIUM CHLORIDE 0.9 % IV SOLN
250.0000 mL | INTRAVENOUS | Status: DC | PRN
Start: 2013-11-04 — End: 2013-11-05

## 2013-11-04 MED ORDER — SODIUM CHLORIDE 0.9 % IJ SOLN
3.0000 mL | Freq: Two times a day (BID) | INTRAMUSCULAR | Status: DC
  Administered 2013-11-04: 22:00:00 3 mL via INTRAVENOUS

## 2013-11-04 MED ORDER — MUPIROCIN 2 % EX OINT
TOPICAL_OINTMENT | CUTANEOUS | Status: AC
  Administered 2013-11-04: 1 via NASAL
  Filled 2013-11-04: qty 22

## 2013-11-04 MED ORDER — SODIUM CHLORIDE 0.9 % IJ SOLN: 3.0000 mL | INTRAMUSCULAR | Status: DC | PRN

## 2013-11-04 MED ORDER — SODIUM CHLORIDE 0.9 % IV SOLN
INTRAVENOUS | Status: DC
  Administered 2013-11-04: 1000 mL via INTRAVENOUS

## 2013-11-04 MED ORDER — HYDROCODONE-ACETAMINOPHEN 5-325 MG PO TABS
1.0000 | ORAL_TABLET | ORAL | Status: DC | PRN
  Administered 2013-11-04: 18:00:00 2 via ORAL
  Administered 2013-11-04: 1 via ORAL
  Administered 2013-11-05 (×2): 2 via ORAL
  Filled 2013-11-04 (×4): qty 2

## 2013-11-04 MED ORDER — CEFAZOLIN SODIUM 1-5 GM-% IV SOLN
1.0000 g | Freq: Four times a day (QID) | INTRAVENOUS | Status: AC
  Administered 2013-11-04 – 2013-11-05 (×3): 1 g via INTRAVENOUS
  Filled 2013-11-04 (×3): qty 50

## 2013-11-04 NOTE — Op Note (Signed)
SURGEON:  Hillis RangeJames Roshanda Balazs, MD      PREPROCEDURE DIAGNOSES:   1. Hypertrophic Cardiomyopathy  2. Syncope  3. Symptomatic sinus bradycardia     POSTPROCEDURE DIAGNOSES:   1. Hypertrophic Cardiomyopathy  2. Syncope  3. Symptomatic sinus bradycardia     PROCEDURES:    1. ICD implantation.  2. Left upper extremity venography  3. Defibrillation threshold testing     INTRODUCTION: David Barrenatrick Walen is a 38 y.o. male with a hypertrophic cardiomyopathy and recurrent syncope, NSVT, and symptomatic sinus bradycardia. At this time, he meets criteria for ICD implantation for primary prevention of sudden death and for treatment of syncope.  The patient therefore  presents today for ICD implantation.      DESCRIPTION OF PROCEDURE:  Informed written consent was obtained and the patient was brought to the electrophysiology lab in the fasting state. The patient was adequately sedated with intravenous Versed, and fentanyl as outlined in the nursing report.  The patient's left chest was prepped and draped in the usual sterile fashion by the EP lab staff.  The skin overlying the left deltopectoral region was infiltrated with lidocaine for local analgesia.  A 5-cm incision was made over the left deltopectoral region.  A left subcutaneous defibrillator pocket was fashioned using a combination of sharp and blunt dissection.  Electrocautery was used to assure hemostasis.   Left Upper extremity Venography:  A venogram of the left upper extremity was performed which revealed a moderate sized left axillary vein which emptied into a moderate sized left subclavian vein.    RA/RV Lead Placement: The left axillary vein was cannulated with fluoroscopic visualization.  Through the left axillary vein, a [employer] Jude Medical Tendril STS, model  616-130-90302088TC-46  (serial # [id number]) right atrial lead and a [employer] Medical [place], model [id number]-117122Q-65 (serial number [id number]) right ventricular defibrillator lead were advanced with fluoroscopic  visualization into the right atrial appendage and right ventricular apex positions respectively.  Initial atrial lead P-waves measured 5.9 mV with an impedance of 536 ohms and a threshold of 1.2 volts at 0.5 milliseconds.  The right ventricular lead R-wave measured 12 mV with impedance of 900 ohms and a threshold of 0.5 volts at 0.5 milliseconds.   The leads were secured to the pectoralis  fascia using #2 silk suture over the suture sleeves.  The pocket then  irrigated with copious gentamicin solution.  The leads were then  connected to a [facility] Fortify Assura DR model [id number] (serial  Number [id number]) ICD.  The defibrillator was placed into the  pocket.  The pocket was then closed in 2 layers with 2.0 Vicryl suture  for the subcutaneous and subcuticular layers.  Steri-Strips and a  sterile dressing were then applied.   DFT Testing: Defibrillation Threshold testing was then performed. Ventricular fibrillation was induced with a T shock.  Adequate sensing of ventricular  fibrillation was observed with minimal dropout with a programmed sensitivity of 1.240mV.  The patient was successfully defibrillated to sinus rhythm with a single 15 joules shock delivered from the device with an impedance of 75 ohms in a duration of 4.5 seconds.  The patient remained in sinus rhythm thereafter.  There were no early apparent complications.     CONCLUSIONS:   1. Hypertrophic cardiomyopathy and recurrent syncope, NSVT, and symptomatic sinus bradycardia  2. Successful ICD implantation.   3. DFT less than or equal to 15 joules.   4. No early apparent complications.

## 2013-11-04 NOTE — H&P (Addendum)
PCP: [name]  Primary Cardiologist: David Booker   David Booker is a 38 y.o. male who presents today for ICD implantation.  He has hypertrophic cardiomyopathy and recurrent syncope.  He has continued to have episodes of syncope and pre-syncope associated with light levels of exertion. His father has been diagnosed with HCM after having a VT arrest and subsequently had an ICD implanted. Recent MRI has shown marked asymmetric hypertrophy of the mid septum at 20mm similar to his fathers pattern.  Last echocardiogram in 06/14/13 demonstrated an EF of60% with mid septal hypertrophy and no gradient at rest.  He [facility] in [facility] hypertrophic CM clinic ([facility]).   Dr David Booker agrees that ICD implantation is indicated for treatment of syncope and prevention of sudden death.  He was started on beta blockers with hopes that backup pacing support from an ICD would help with prior sudden bradycardias.  Since last being seen in our clinic, the patient reports doing very well. Today, he denies symptoms of palpitations, chest pain, shortness of breath, orthopnea, PND, lower extremity edema, or neurologic sequela. The patient feels that he is tolerating medications without difficulties and is otherwise without complaint today.   Past Medical History   Diagnosis  Date   .  Anxiety    .  Palpitations    .  Syncope    .  Back pain      without radiation   .  Genital HSV    .  Tachycardia, unspecified    .  Syncope and collapse    .  Depressive disorder, not elsewhere classified    .  Other specified circulatory system disorders    .  Allergic rhinitis, cause unspecified    .  Viral syndrome    .  Abnormal heart rhythm    .  Heartburn      Long Hx   .  Substernal chest pain  04/22/13     Pt reports having vague symptoms of substernal CP, different from heartburn in that it is dull & radiatesto the back. Occurring at least 3 episodes over past several weeks. Pain free in between  episodes. (04/22/13 Dr. Carman ChingJames Edwards GI)   .  Esophageal reflux      Chronic but his current pain appears to be different than his radiating to the back. Possible biliary disease. (04/22/13 Dr. Carman ChingJames Edwards, GI)   .  Wide QRS ventricular tachycardia      Noted on event monitor (04/20/12-05/20/12) at night   .  Bradycardia      Noted on event monitor (04/20/12-05/20/12) Just before bradycardia was recorded, HR was approx. 130bpm   .  Near syncope      While wearing event monitor (04/20/12-05/20/12) 46bpm. Also had chest tightness at this time. Possible vagal ([place]ains 05/22/12)   .  PVC (premature ventricular contraction)      Noted on event monitor (04/20/12-05/20/12)    Past Surgical History   Procedure  Laterality  Date   .  Shoulder surgery  Right      Dislocated    Current Outpatient Prescriptions   Medication  Sig  Dispense  Refill   .  ALPRAZolam (XANAX) 0.5 MG tablet  Take 0.5 mg by mouth as needed.     Marland Kitchen.  escitalopram (LEXAPRO) 20 MG tablet  Take 20 mg by mouth daily.     .  Omeprazole Magnesium (PRILOSEC OTC PO)  Take 1 tablet by mouth 2 (two)  times daily.     .  valACYclovir (VALTREX) 500 MG tablet  Take 500 mg by mouth daily.      No current facility-administered medications for this visit.   No Known Allergies  History    Social History   .  Marital Status:  Married     Spouse Name:  N/A     Number of Children:  N/A   .  Years of Education:  N/A    Occupational History   .  Not on file.    Social History Main Topics   .  Smoking status:  Never Smoker   .  Smokeless tobacco:  Not on file   .  Alcohol Use:  Yes      Comment: 1-2 mixed drinks per day   .  Drug Use:  No   .  Sexual Activity:  Not on file    Other Topics  Concern   .  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  CAD  Paternal Uncle      paternal uncle died suddenly, per pt  autopsy confirmed MI   .  Breast cancer  Mother  22   .  Hiatal hernia  Father    .  Cardiomyopathy  Father      Hypertrophic cardiomyopathy HOCM   .  Anxiety disorder  Sister    .  Heart disease  Paternal Uncle      Has a VAD and IDC   .  Cardiomyopathy  Paternal Uncle      Hypertrophic cardiomyopathy HOCM   .  Diabetes  Paternal Grandmother      Hypertrophic cardiomyopathy   .  Diabetes  Paternal Grandfather    .  Cardiomyopathy  Paternal Grandfather      Hypertrophic cardiomyopathy HOCM   .  Heart murmur  Sister    .  GER disease  Father    .  Supraventricular tachycardia  Father    .  Heart attack  Paternal Uncle  51   .  Diabetes  Paternal Uncle    .  Cardiomyopathy  Cousin      hypertrophic cardiomyopathy   .  Heart disease  Cousin      mitral valve repair and heart ablation surgery   .  Macular degeneration  Maternal Grandmother    .  Scoliosis  Mother    .  Osteoporosis  Mother    .  Marfan syndrome  Maternal Aunt    .  Scoliosis  Maternal Aunt    .  Mitral valve prolapse  Maternal Aunt    .  Mitral valve prolapse  Maternal Uncle    .  Other  Maternal Uncle      connective tissue issues   .  Mitral valve prolapse  Cousin    .  Other  Cousin      thoracic output surgery   .  Other  Cousin      thoracic output surgery      Physical Exam:  Filed Vitals:   11/04/13 1255  BP: 117/81  Pulse: 71  Temp: 98.7 F (37.1 C)  Resp: 20    GEN- The patient is well appearing, alert and oriented x 3 today.  Head- normocephalic, atraumatic  Eyes- Sclera clear, conjunctiva pink  Ears-  hearing intact  Oropharynx- clear  Neck- supple, no JVP  Lymph- no cervical lymphadenopathy  Lungs- Clear to ausculation bilaterally, normal work of breathing  Heart- Regular rate and rhythm, I/VI SEM LUSB GI- soft, NT, ND, + BS  Extremities- no clubbing, cyanosis, or edema  MS- no significant deformity or atrophy  Skin- no rash or lesion  Psych- euthymic mood, full affect  Neuro-  strength and sensation are intact   EKG today is reviewed and reveals sinus rhythm   Assessment and Plan:  1. Syncope/ hypertrophic cardiomyopathy The patient has familial hypertrophic CM with NSVT and syncope.  Dr David Booker and I agree that ICD implantation is indicated. At this time, he meets criteria for ICD implantation for primary prevention of sudden death and treatment of recurrent syncope.  Risks, benefits, alternatives to ICD implantation were discussed in detail with the patient and his spouse today. The patient and his spouse  understand that the risks include but are not limited to bleeding, infection, pneumothorax, perforation, tamponade, vascular damage, renal failure, MI, stroke, death, inappropriate shocks, and lead dislodgement and wishes to proceed at this time. We also discussed SICD.  Given need for pacing with abrupt onset bradycardias previously documented, he is not a good candidate for S-ICD.  I would favor a SJM transvenous system which has the highest shock output delivery for this patient with HCM.   ICD Criteria  Current LVEF:60% ;Obtained > 3 months ago and < or = 6 months ago.   NYHA Functional Classification: Class I  Heart Failure History:  No.  Non-Ischemic Dilated Cardiomyopathy History:  No.  Atrial Fibrillation/Atrial Flutter:  No.  Ventricular Tachycardia History:  Yes, Hemodynamic instability present, VT Type:  NSVT.  Cardiac Arrest History:  No  History of Syndromes with Risk of Sudden Death:  Yes, Other, hypertrophic cardiomyopathy with syncope and NSVT  Previous ICD:  No.  Electrophysiology Study: No.  Prior MI: No.  PPM: No.  OSA:  No  Patient Life Expectancy of >=1 year: Yes.  Anticoagulation Therapy:  Patient is NOT on anticoagulation therapy.   Beta Blocker Therapy:  Yes.   Ace Inhibitor/ARB Therapy:  No, Reason not on Ace Inhibitor/ARB therapy:  preserved EF.  ACE inhibitor is not indicated for this patient

## 2013-11-05 ENCOUNTER — Ambulatory Visit (HOSPITAL_COMMUNITY): Payer: BC Managed Care – PPO

## 2013-11-05 DIAGNOSIS — I421 Obstructive hypertrophic cardiomyopathy: Secondary | ICD-10-CM

## 2013-11-05 LAB — BASIC METABOLIC PANEL
BUN: 19 mg/dL (ref 6–23)
CALCIUM: 9.3 mg/dL (ref 8.4–10.5)
CO2: 24 mEq/L (ref 19–32)
CREATININE: 0.82 mg/dL (ref 0.50–1.35)
Chloride: 100 mEq/L (ref 96–112)
GFR calc non Af Amer: >90 mL/min (ref >90)
Glucose, Bld: 96 mg/dL (ref 70–99)
Potassium: 4.1 mEq/L (ref 3.7–5.3)
Sodium: 140 mEq/L (ref 137–147)

## 2013-11-05 MED ORDER — HYDROCODONE-ACETAMINOPHEN 5-325 MG PO TABS: 1.0000 | ORAL_TABLET | ORAL | Status: DC | PRN

## 2013-11-05 NOTE — Discharge Summary (Signed)
ELECTROPHYSIOLOGY PROCEDURE DISCHARGE SUMMARY    Patient ID: David Booker,  MRN: [id number], DOB/AGE: Jan 01, 1976 38 y.o.  Admit date: 11/04/2013 Discharge date: 11/05/2013  Primary Care Physician: Hillis RangeJames Allred, MD Primary [facility]ologist: [facility] Electrophysiologist: Allred  Primary Discharge Diagnosis:  Hypertrophic cardiomyopathy and symptomatic bradycardia status post ICD implantation this admission  Secondary Discharge Diagnosis:  1.  Recurrent syncope 2.  Anxiety 3.  NSVT  No Known Allergies   Procedures This Admission:  1.  Implantation of a STJ dual chamber ICD on 11-04-2013 by Dr Johney FrameAllred.  The patient received a STJ model number [id number] ICD with model number 2088 right atrial lead, 7122Q right ventricular lead.  DFT's were successful at 15 J.  There were no immediate post procedure complications. 2.  CXR on 11-05-2013 demonstrated no pneumothorax status post device implantation.   Brief HPI: David Booker is a 38 y.o. male with a past medical history as outlined above.  He has had recurrent syncope, hypertrophic cardiomyopathy, and NSVT.  [facility] and ICD recommendation was recommended.   Risks, benefits, and alternatives to ICD implantation were reviewed with the patient who wished to proceed.   Hospital Course:  The patient was admitted and underwent implantation of a STJ dual chamber ICD with details as outlined above.   He was monitored on telemetry overnight which demonstrated sinus rhythm.  Left chest was without hematoma or ecchymosis.  The device was interrogated and found to be functioning normally.  CXR was obtained and demonstrated no pneumothorax status post device implantation.  Wound care, arm mobility, and restrictions were reviewed with the patient.  Dr Graciela HusbandsKlein examined the patient and considered them stable for discharge to home.   The patient is aware that he should not drive for 6 months following his last syncopal spell.    The patient's discharge medications include a beta blocker (Metropolol).  ACE-I is not indicated due to normal LV function.    Discharge Vitals: Blood pressure 124/90, pulse 72, temperature 97.6 F (36.4 C), temperature source Oral, resp. rate 18, height 6\' 1"  (1.854 m), weight 212 lb 1.3 oz (96.2 kg), SpO2 96.00%.  Well developed and nourished in no acute distress HENT normal Neck supple with JVP-flat Clear Device pocket without hematoma Regular rate and rhythm, no murmurs or gallops Abd-soft with active BS No Clubbing cyanosis edema Skin-warm and dry A & Oriented  Grossly normal sensory and motor function   Labs:   Lab Results  Component Value Date   WBC 7.3 10/28/2013   HGB 14.3 10/28/2013   HCT 42.0 10/28/2013   MCV 83.2 10/28/2013   PLT 293.0 10/28/2013    Recent Labs Lab 11/05/13 0340  NA 140  K 4.1  CL 100  CO2 24  BUN 19  CREATININE 0.82  CALCIUM 9.3  GLUCOSE 96     Discharge Medications:    Medication List    ASK your doctor about these medications       ALPRAZolam 0.5 MG tablet  Commonly known as:  XANAX  Take 0.5 mg by mouth as needed.     aspirin EC 81 MG tablet  Take 81 mg by mouth daily.     escitalopram 20 MG tablet  Commonly known as:  LEXAPRO  Take 20 mg by mouth daily.     loratadine 10 MG tablet  Commonly known as:  CLARITIN  Take 10 mg by mouth daily.     metoprolol succinate 25 MG 24 hr tablet  Commonly known as:  TOPROL-XL  Take 25 mg by mouth daily.     PRILOSEC OTC PO  Take 1 tablet by mouth 2 (two) times daily.     valACYclovir 500 MG tablet  Commonly known as:  VALTREX  Take 500 mg by mouth daily.         Duration of Discharge Encounter: Greater than 30 minutes including physician time.  Signed,  Instructions reviewed Discharge with vicodin

## 2013-11-05 NOTE — Discharge Instructions (Signed)
° °  Supplemental Discharge Instructions for  Pacemaker/Defibrillator Patients  Activity No heavy lifting or vigorous activity with your left/right arm for 6 to 8 weeks.  Do not raise your left/right arm above your head for one week.  Gradually raise your affected arm as drawn below.                       06/29                     06/30                       07/01                       07/02            NO DRIVING for  one week    ; you may begin driving on     96/0407/02     . WOUND CARE   Keep the wound area clean and dry.  Do not get this area wet for one week. No showers for one week; you may shower on      07/02        .   The tape/steri-strips on your wound will fall off; do not pull them off.  No bandage is needed on the site.  DO  NOT apply any creams, oils, or ointments to the wound area.   If you notice any drainage or discharge from the wound, any swelling or bruising at the site, or you develop a fever > 101? F after you are discharged home, call the office at once.  Special Instructions   You are still able to use cellular telephones; use the ear opposite the side where you have your pacemaker/defibrillator.  Avoid carrying your cellular phone near your device.   When traveling through airports, show security personnel your identification card to avoid being screened in the metal detectors.  Ask the security personnel to use the hand wand.   Avoid arc welding equipment, MRI testing (magnetic resonance imaging), TENS units (transcutaneous nerve stimulators).  Call the office for questions about other devices.   Avoid electrical appliances that are in poor condition or are not properly grounded.   Microwave ovens are safe to be near or to operate.  Additional information for defibrillator patients should your device go off:   If your device goes off ONCE and you feel fine afterward, notify the device clinic nurses.   If your device goes off ONCE and you do not feel well afterward, call  911.   If your device goes off TWICE, call 911.   If your device goes off THREE times in one day, call 911.  DO NOT DRIVE YOURSELF OR A FAMILY MEMBER WITH A DEFIBRILLATOR TO THE HOSPITAL--CALL 911.

## 2013-11-15 ENCOUNTER — Ambulatory Visit (INDEPENDENT_AMBULATORY_CARE_PROVIDER_SITE_OTHER): Payer: BC Managed Care – PPO | Admitting: *Deleted

## 2013-11-15 DIAGNOSIS — Z95810 Presence of automatic (implantable) cardiac defibrillator: Secondary | ICD-10-CM

## 2013-11-15 DIAGNOSIS — R55 Syncope and collapse: Secondary | ICD-10-CM

## 2013-11-15 DIAGNOSIS — I422 Other hypertrophic cardiomyopathy: Secondary | ICD-10-CM

## 2013-11-15 LAB — MDC_IDC_ENUM_SESS_TYPE_INCLINIC
Battery Remaining Longevity: 97.2 mo
Date Time Interrogation Session: 20150706144605
HighPow Impedance: 64.125
Implantable Pulse Generator Model: 2357
Lead Channel Impedance Value: 575 Ohm
Lead Channel Pacing Threshold Amplitude: 0.75 V
Lead Channel Pacing Threshold Amplitude: 0.75 V
Lead Channel Pacing Threshold Pulse Width: 0.5 ms
Lead Channel Pacing Threshold Pulse Width: 0.5 ms
Lead Channel Pacing Threshold Pulse Width: 0.5 ms
Lead Channel Sensing Intrinsic Amplitude: 4.7 mV
Lead Channel Setting Sensing Sensitivity: 0.5 mV
MDC IDC MSMT LEADCHNL RA IMPEDANCE VALUE: 512.5 Ohm
MDC IDC MSMT LEADCHNL RA PACING THRESHOLD AMPLITUDE: 0.5 V
MDC IDC MSMT LEADCHNL RA PACING THRESHOLD AMPLITUDE: 0.5 V
MDC IDC MSMT LEADCHNL RA PACING THRESHOLD PULSEWIDTH: 0.5 ms
MDC IDC MSMT LEADCHNL RV SENSING INTR AMPL: 12 mV
MDC IDC PG SERIAL: 7199672
MDC IDC SET LEADCHNL RA PACING AMPLITUDE: 3.5 V
MDC IDC SET LEADCHNL RV PACING AMPLITUDE: 3.5 V
MDC IDC SET LEADCHNL RV PACING PULSEWIDTH: 0.5 ms
MDC IDC SET ZONE DETECTION INTERVAL: 250 ms
MDC IDC STAT BRADY RA PERCENT PACED: 15 %
MDC IDC STAT BRADY RV PERCENT PACED: 0.19 %
Zone Setting Detection Interval: 300 ms
Zone Setting Detection Interval: 330 ms

## 2013-11-15 NOTE — Progress Notes (Signed)
Wound check appointment. Steri-strips removed. Wound without redness or edema. Incision edges approximated, wound well healed. Normal device function. Thresholds, sensing, and impedances consistent with implant measurements. Device programmed at 3.5V for extra safety margin until 3 month visit. Histogram distribution appropriate for patient and level of activity. No mode switches or ventricular arrhythmias noted. Patient educated about wound care, arm mobility, lifting restrictions, shock plan. ROV w/ Dr. Johney FrameAllred in 4mo.

## 2013-11-30 ENCOUNTER — Telehealth: Payer: Self-pay | Admitting: Internal Medicine

## 2013-11-30 ENCOUNTER — Ambulatory Visit (INDEPENDENT_AMBULATORY_CARE_PROVIDER_SITE_OTHER): Payer: BC Managed Care – PPO | Admitting: *Deleted

## 2013-11-30 ENCOUNTER — Encounter: Payer: Self-pay | Admitting: Internal Medicine

## 2013-11-30 DIAGNOSIS — Z95810 Presence of automatic (implantable) cardiac defibrillator: Secondary | ICD-10-CM

## 2013-11-30 LAB — MDC_IDC_ENUM_SESS_TYPE_INCLINIC
Battery Remaining Longevity: 93.6 mo
Brady Statistic RA Percent Paced: 18 %
Implantable Pulse Generator Model: 2357
Implantable Pulse Generator Serial Number: 7199672
Lead Channel Sensing Intrinsic Amplitude: 4.4 mV
Lead Channel Setting Pacing Amplitude: 3.5 V
Lead Channel Setting Pacing Pulse Width: 0.5 ms
MDC IDC MSMT LEADCHNL RV SENSING INTR AMPL: 11.4 mV
MDC IDC SESS DTM: 20150721175153
MDC IDC SET LEADCHNL RA PACING AMPLITUDE: 3.5 V
MDC IDC SET LEADCHNL RV SENSING SENSITIVITY: 0.5 mV
MDC IDC SET ZONE DETECTION INTERVAL: 300 ms
MDC IDC STAT BRADY RV PERCENT PACED: 0.21 %
Zone Setting Detection Interval: 250 ms
Zone Setting Detection Interval: 330 ms

## 2013-11-30 NOTE — Telephone Encounter (Signed)
Transmission shows no alerts or episodes. Pt coming to clinic today to optimize hysteresis settings w/ industry @4 :45.

## 2013-11-30 NOTE — Telephone Encounter (Signed)
Pt was walking upstairs when he suddenly got light-headed and passed out at top of staircase. 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

## 2013-11-30 NOTE — Telephone Encounter (Signed)
New message         Pt passed out at 8:45 last night / Pt would like to be seen this week.

## 2013-11-30 NOTE — Progress Notes (Signed)
Hysteresis optimizing w/ Industry. Pt had syncopal episode last night while walking upstairs. Changed lower rate from 80 to 90bpm, hysteresis rate from 40 to 50bpm, cycle count from 5 to 3 cycles, & search interval from 10 to 15min. Pt walked down/up stairs w/ no abnormalities. Changed PVAB from 90 to 100ms, PVARP from 275 to 250ms. Pt temp paced AAI @140bpm  to test SA node recovery, all results normal. Pt given magnet to use as symptom activator only. Pt given detailed magnet instructions. ROV w/ Dr. Johney FrameAllred in Oct. unless additional adjustments are discovered/necessary.

## 2013-12-02 ENCOUNTER — Encounter: Payer: Self-pay | Admitting: Internal Medicine

## 2013-12-15 ENCOUNTER — Telehealth: Payer: Self-pay | Admitting: *Deleted

## 2013-12-15 NOTE — Telephone Encounter (Signed)
Received call from [facility]. He is seeing Dr. Vickki MuffWeston for cleaning today. Office is asking if pt needs premedication prior to cleaning. Call back number is [phone]

## 2013-12-15 NOTE — Telephone Encounter (Addendum)
Will need antibiotic for 6 weeks post ICD insertion. I spoke with Archie Pattenonya at [facility] and gave her this information. They have already rescheduled his appointment for 6 weeks from now.  Archie Pattenonya is also asking if his device is shielded. Will forward to device to follow up

## 2013-12-16 NOTE — Telephone Encounter (Signed)
Spoke with [facility].  Ultrasonic cleaning tools can be used if 6 inches from the device.

## 2013-12-21 ENCOUNTER — Telehealth: Payer: Self-pay | Admitting: Internal Medicine

## 2013-12-21 NOTE — Telephone Encounter (Signed)
New message     Pt passed out last Thursday---he has a defibulator.  Did we get that recorded on his read out

## 2013-12-21 NOTE — Telephone Encounter (Signed)
Spoke with patient.  He had a syncopal episode 12/16/13 and attempted to make a snap shot recording with the magnet.  No arrhythmias noted on Merlin.

## 2014-02-01 ENCOUNTER — Telehealth: Payer: Self-pay | Admitting: Cardiology

## 2014-02-01 NOTE — Telephone Encounter (Signed)
Pt called and stated that he had a syncopal episode on Friday 01-28-14 around 9:00 PM. 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 I informed pt that since he passed out that from the day of the episode he can not drive for 6 months. Please call pt back and let him know if an episode occurred.

## 2014-02-01 NOTE — Telephone Encounter (Signed)
Pt's episode occurred 15-30sec prior to applying his magnet. Episode only shows 9sec prior to applying magnet. I will contact industry to see if additional data can be recorded.

## 2014-02-02 NOTE — Telephone Encounter (Signed)
Pt was leaving magnet on his device for two seconds as discussed during previous in clinic appt w/ rep. Industry/Bryan suggested leaving magnet on his device slightly longer to potentially record more data. Concern is how much hindsight data is recorded after an episode. The end goal is to view more hindsight data.  We also discussed the very delicate nature of applying a magnet during a true VT/VF episode and the potential danger that could result. Pt expressed understanding of goal of magnet event recording vs. cautionary device inhibition application.  Pt will attempt to have magnet available when presyncopal sensations arise. Pt will use more liberty to create magnet episodes. I agreed to his suggestion. Pt will call if he experiences another syncopal episode. Pt's home monitoring also working well.

## 2014-02-07 ENCOUNTER — Encounter: Payer: Self-pay | Admitting: Internal Medicine

## 2014-02-07 ENCOUNTER — Ambulatory Visit (INDEPENDENT_AMBULATORY_CARE_PROVIDER_SITE_OTHER): Payer: BC Managed Care – PPO | Admitting: Internal Medicine

## 2014-02-07 VITALS — BP 124/76 | HR 72 | Ht 73.0 in | Wt 207.0 lb

## 2014-02-07 DIAGNOSIS — I422 Other hypertrophic cardiomyopathy: Secondary | ICD-10-CM

## 2014-02-07 DIAGNOSIS — R55 Syncope and collapse: Secondary | ICD-10-CM

## 2014-02-07 LAB — MDC_IDC_ENUM_SESS_TYPE_INCLINIC
Brady Statistic RV Percent Paced: 3.3 %
Date Time Interrogation Session: 20150928160551
HIGH POWER IMPEDANCE MEASURED VALUE: 75 Ohm
Implantable Pulse Generator Model: 2357
Implantable Pulse Generator Serial Number: 7199672
Lead Channel Impedance Value: 575 Ohm
Lead Channel Pacing Threshold Amplitude: 0.5 V
Lead Channel Pacing Threshold Amplitude: 0.75 V
Lead Channel Pacing Threshold Pulse Width: 0.5 ms
Lead Channel Sensing Intrinsic Amplitude: 12 mV
Lead Channel Setting Pacing Amplitude: 2.5 V
MDC IDC MSMT BATTERY REMAINING LONGEVITY: 96 mo
MDC IDC MSMT LEADCHNL RA PACING THRESHOLD AMPLITUDE: 0.75 V
MDC IDC MSMT LEADCHNL RA PACING THRESHOLD PULSEWIDTH: 0.5 ms
MDC IDC MSMT LEADCHNL RA PACING THRESHOLD PULSEWIDTH: 0.5 ms
MDC IDC MSMT LEADCHNL RA SENSING INTR AMPL: 5 mV
MDC IDC MSMT LEADCHNL RV IMPEDANCE VALUE: 762.5 Ohm
MDC IDC MSMT LEADCHNL RV PACING THRESHOLD AMPLITUDE: 0.5 V
MDC IDC MSMT LEADCHNL RV PACING THRESHOLD PULSEWIDTH: 0.5 ms
MDC IDC SET LEADCHNL RA PACING AMPLITUDE: 2 V
MDC IDC SET LEADCHNL RV PACING PULSEWIDTH: 0.5 ms
MDC IDC SET LEADCHNL RV SENSING SENSITIVITY: 0.5 mV
MDC IDC SET ZONE DETECTION INTERVAL: 250 ms
MDC IDC STAT BRADY RA PERCENT PACED: 31 %
Zone Setting Detection Interval: 300 ms
Zone Setting Detection Interval: 330 ms

## 2014-02-07 MED ORDER — METOPROLOL SUCCINATE ER 50 MG PO TB24: 50.0000 mg | ORAL_TABLET | Freq: Every day | ORAL | Status: DC

## 2014-02-07 NOTE — Patient Instructions (Signed)
Your physician has recommended you make the following change in your medication:  1.) increase Toprol  To 50 mg daily  Your physician recommends that you schedule a follow-up appointment in: 6 weeks with Dr. [name] Your physician wants you to follow-up in: 1 year with Dr. [name].  You will receive a reminder letter in the mail two months in advance. If you don't receive a letter, please call our office to schedule the follow-up appointment.

## 2014-02-07 NOTE — Progress Notes (Signed)
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  Since having his ICD implanted, the patient reports doing very well.  Today, he denies symptoms of palpitations, chest pain, shortness of breath,  lower extremity edema, dizziness, or ICD shocks.  He has had improved and rare syncope when going quickly up the stairs.  His ICD was adjusted with rate hysteresis to try to prevent sudden drops in heart rate. The patient is otherwise without complaint today.   Past Medical History  Diagnosis Date  . Anxiety   . Palpitations   . Syncope   . Back pain     without radiation  . Genital HSV   . Tachycardia, unspecified   . Syncope and collapse   . Depressive disorder, not elsewhere classified   . Other specified circulatory system disorders   . Allergic rhinitis, cause unspecified   . Viral syndrome   . Abnormal heart rhythm   . Heartburn     Long Hx  . Substernal chest pain 04/22/13    Pt reports having vague symptoms of substernal CP, different from heartburn in that it is dull & radiatesto the back. Occurring at least 3 episodes over past several weeks. Pain free in between episodes. (04/22/13 Dr. Carman Ching GI)   . Esophageal reflux     Chronic but his current pain appears to be different than his radiating to the back. Possible biliary disease. (04/22/13 Dr. Carman Ching, GI)  . Wide QRS ventricular tachycardia     Noted on event monitor (04/20/12-05/20/12) at night  . Bradycardia     Noted on event monitor (04/20/12-05/20/12) Just before bradycardia was recorded, HR was approx. 130bpm  . Near syncope     While wearing event monitor (04/20/12-05/20/12) 46bpm. Also had chest tightness at this time. Possible vagal (Skains 05/22/12)  . PVC (premature ventricular contraction)     Noted on event monitor (04/20/12-05/20/12)    Past Surgical History  Procedure Laterality Date  .  Shoulder surgery Right     Dislocated  . Cardiac defibrillator placement  11/04/13    SJM Fortify Assura DR implanted by Dr [name] for hypertrophic CM and syncope    ROS- all systems are reviewed and negative except as per HPI above  Current Outpatient Prescriptions  Medication Sig Dispense Refill  . ALPRAZolam (XANAX) 0.5 MG tablet Take 0.5 mg by mouth daily as needed for anxiety.       Marland Kitchen escitalopram (LEXAPRO) 20 MG tablet Take 20 mg by mouth daily.       . Omeprazole Magnesium (PRILOSEC OTC PO) Take 1 tablet by mouth 2 (two) times daily.       . valACYclovir (VALTREX) 500 MG tablet Take 500 mg by mouth daily.       Marland Kitchen aspirin EC 81 MG tablet Take 81 mg by mouth daily.      . metoprolol succinate (TOPROL-XL) 50 MG 24 hr tablet Take 1 tablet (50 mg total) by mouth daily. Take with or immediately following a meal.  90 tablet  3   No current facility-administered medications for this visit.    Physical Exam: Filed Vitals:   02/07/14 1251  BP: 124/76  Pulse: 72  Height:  (1.854 m)  Weight: 207 lb (93.895 kg)    GEN- The patient is well appearing, alert and oriented x 3 today.   Head- normocephalic, atraumatic Eyes-  Sclera clear, conjunctiva pink  Ears- hearing intact Oropharynx- clear Lungs- Clear to ausculation bilaterally, normal work of breathing Chest- ICD pocket is well healed Heart- Regular rate and rhythm, no murmurs, rubs or gallops, PMI not laterally displaced GI- soft, NT, ND, + BS Extremities- no clubbing, cyanosis, or edema  ICD interrogation- reviewed in detail today,  See PACEART report  Assessment and Plan:  1.  Hypertrophic CM with syncope Normal ICD function See Pace Art report No changes today No arrhythmias detected.  He can use a magnet to trigger a captured electrogram if he has further syncope. By history, I am suspicious that he may be having increased outflow gradient with sudden exertion as the cause for his presyncope.  I will therefore  increase his toprol to  daily today.  He will follow up with Dr [name] for [facility] management.  Merlin Return to see me in 1 year

## 2014-03-18 ENCOUNTER — Encounter: Payer: Self-pay | Admitting: Cardiology

## 2014-03-18 ENCOUNTER — Ambulatory Visit (INDEPENDENT_AMBULATORY_CARE_PROVIDER_SITE_OTHER): Payer: BC Managed Care – PPO | Admitting: Cardiology

## 2014-03-18 VITALS — BP 118/88 | HR 77 | Ht 73.0 in | Wt 211.0 lb

## 2014-03-18 DIAGNOSIS — Z95810 Presence of automatic (implantable) cardiac defibrillator: Secondary | ICD-10-CM

## 2014-03-18 DIAGNOSIS — I422 Other hypertrophic cardiomyopathy: Secondary | ICD-10-CM

## 2014-03-18 DIAGNOSIS — R55 Syncope and collapse: Secondary | ICD-10-CM

## 2014-03-18 MED ORDER — METOPROLOL SUCCINATE ER 100 MG PO TB24: 100.0000 mg | ORAL_TABLET | Freq: Every day | ORAL | Status: DC

## 2014-03-18 NOTE — Progress Notes (Signed)
[address] [place], [place]  [address] Phone: [phone]) [phone] Fax:  [phone](336) [phone]  Date:  03/18/2014   ID:  David Booker Primiano, DOB 1975/11/26, MRN [id number]  PCP:  Hillis RangeAllred, James, MD   History of Present Illness: David Booker is a 38 y.o. male Status post ICD implant, hypertrophic cardiomyopathy, doing well area. He can use a magnet to trigger a captured electrocardiogram if he has further syncope according to Dr. Jenel LucksAllred's note. Dr. Johney FrameAllred was suspicious that he may be having increased outflow gradient with sudden exertion as the cause of syncope. His Toprol was increased to 50 mg daily at that time.  He's had several prior episodes of syncope one time while walking briskly to catch a ferry, episodes usually asked 15 seconds, pounding slow.Holter monitor captured nonsustained VT which was a symptomatically. Could've been neurocardiogenic in etiology with cardioinhibitory response captured on of that monitor. After another event, an ICD was placed. MRI showed marked asymmetric hypertrophy of the mid septum of 20 mm similar to his father's pattern. His father was recently diagnosed with hypertrophic cardiomyopathy after VT arrest and subsequently had ICD implanted.  Despite the increase in Toprol to 50 mg, he is still having symptoms of near syncope when initiating exercise.  Wt Readings from Last 3 Encounters:  03/18/14 211 lb (95.709 kg)  02/07/14 207 lb (93.895 kg)  11/05/13 212 lb 1.3 oz (96.2 kg)     Past Medical History  Diagnosis Date  . Anxiety   . Palpitations   . Syncope   . Back pain     without radiation  . Genital HSV   . Tachycardia, unspecified   . Syncope and collapse   . Depressive disorder, not elsewhere classified   . Other specified circulatory system disorders   . Allergic rhinitis, cause unspecified   . Viral syndrome   . Abnormal heart rhythm   . Heartburn     Long Hx  . Substernal chest pain 04/22/13    Pt reports having vague  symptoms of substernal CP, different from heartburn in that it is dull & radiatesto the back. Occurring at least 3 episodes over past several weeks. Pain free in between episodes. (04/22/13 Dr. Carman ChingJames Booker GI)   . Esophageal reflux     Chronic but his current pain appears to be different than his radiating to the back. Possible biliary disease. (04/22/13 Dr. Carman ChingJames Booker, GI)  . Wide QRS ventricular tachycardia     Noted on event monitor (04/20/12-05/20/12) at night  . Bradycardia     Noted on event monitor (04/20/12-05/20/12) Just before bradycardia was recorded, HR was approx. 130bpm  . Near syncope     While wearing event monitor (04/20/12-05/20/12) 46bpm. Also had chest tightness at this time. Possible vagal (David Booker 05/22/12)  . PVC (premature ventricular contraction)     Noted on event monitor (04/20/12-05/20/12)     Past Surgical History  Procedure Laterality Date  . Shoulder surgery Right     Dislocated  . Cardiac defibrillator placement  11/04/13    SJM Fortify Assura DR implanted by Dr Johney FrameAllred for hypertrophic CM and syncope    Current Outpatient Prescriptions  Medication Sig Dispense Refill  . ALPRAZolam (XANAX) 0.5 MG tablet Take 0.5 mg by mouth daily as needed for anxiety.     Marland Kitchen. escitalopram (LEXAPRO) 20 MG tablet Take 20 mg by mouth daily.     . metoprolol succinate (TOPROL-XL) 50 MG 24 hr tablet Take 1 tablet (  50 mg total) by mouth daily. Take with or immediately following a meal. 90 tablet 3  . Omeprazole Magnesium (PRILOSEC OTC PO) Take 1 tablet by mouth daily.     . valACYclovir (VALTREX) 500 MG tablet Take 500 mg by mouth daily.      No current facility-administered medications for this visit.    Allergies:   No Known Allergies  Social History:  The patient  reports that he has never smoked. He does not have any smokeless tobacco history on file. He reports that he drinks alcohol. He reports that he does not use illicit drugs.   Family History  Problem Relation  Age of Onset  . CAD Paternal Uncle     paternal uncle died suddenly, per pt autopsy confirmed MI  . Breast cancer Mother 58  . Hiatal hernia Father   . Cardiomyopathy Father     Hypertrophic cardiomyopathy HOCM  . Anxiety disorder Sister   . Heart disease Paternal Uncle     Has a VAD and IDC  . Cardiomyopathy Paternal Uncle     Hypertrophic cardiomyopathy HOCM  . Diabetes Paternal Grandmother     Hypertrophic cardiomyopathy  . Diabetes Paternal Grandfather   . Cardiomyopathy Paternal Grandfather     Hypertrophic cardiomyopathy HOCM  . Heart murmur Sister   . GER disease Father   . Supraventricular tachycardia Father   . Heart attack Paternal Uncle 51  . Diabetes Paternal Uncle   . Cardiomyopathy Cousin     hypertrophic cardiomyopathy  . Heart disease Cousin     mitral valve repair and heart ablation surgery  . Macular degeneration Maternal Grandmother   . Scoliosis Mother   . Osteoporosis Mother   . Marfan syndrome Maternal Aunt   . Scoliosis Maternal Aunt   . Mitral valve prolapse Maternal Aunt   . Mitral valve prolapse Maternal Uncle   . Other Maternal Uncle     connective tissue issues  . Mitral valve prolapse Cousin   . Other Cousin     thoracic output surgery  . Other Cousin     thoracic output surgery    ROS:  Please see the history of present illness.   Denies any fevers, chills, orthopnea, PND   All other systems reviewed and negative.   PHYSICAL EXAM: VS:  BP 118/88 mmHg  Pulse 77  Ht 6\' 1"  (1.854 m)  Wt 211 lb (95.709 kg)  BMI 27.84 kg/m2 Well nourished, well developed, in no acute distresspleasant HEENT: normal, [place]/AT, EOMI Neck: no JVD, normal carotid upstroke, no bruit Cardiac:  normal S1, S2; RRR; 1/6 systolic ejection murmur Lungs:  clear to auscultation bilaterally, no wheezing, rhonchi or rales Abd: soft, nontender, no hepatomegaly, no bruits Ext: no edema, 2+ distal pulses Skin: warm and dry GU: deferred Neuro: no focal abnormalities noted,  AAO x 3  EKG:  None today Cardiac MRI - 20mm septum  ASSESSMENT AND PLAN:  1. Hypertrophic cardiomyopathy-he is still having symptoms of near syncope when starting out exercising, walking that may be related to increased left ventricular outflow tract gradient. Murmur heard on exam today. Toprol was increased from 25-50 at last visit and I will increase this to 100 mg to see if this helps alleviate or improve his outflow tract gradient. Liberalize salt, fluids. Would fludrocortisone help? Defibrillator in place. Father had VT arrest. 2. ICD-doing well. Dr. [name]. 3. syncope-we will focus on possible left ventricular outflow tract reason. Increasing Toprol. Liberalizing fluids. Still could be cardioinhibitory, neurocardiogenic.  Signed, Donato SchultzMark Trinitee Horgan, MD [facility]  03/18/2014 10:36 AM

## 2014-03-18 NOTE — Patient Instructions (Signed)
Please increase your Toprol to 100 mg a day. Continue all other medications as listed.  Please liberalize salt and fluids.  Follow up in 1 month with Dr Anne FuSkains.

## 2014-03-18 NOTE — Progress Notes (Signed)
[name]. I saw that in your previous note. I discussed this with him in clinic today and we have increased his beta blocker once again to 100 mg. Increasing fluids, hydration. I like your idea of performing echocardiogram specifically focused on left ventricular outflow tract gradient,with Valsalva maneuver. I will discuss with him at follow-up.  Hope your enjoying [place]. Hang 10.   [name], [name], MD

## 2014-03-30 ENCOUNTER — Telehealth: Payer: Self-pay | Admitting: Cardiology

## 2014-03-30 NOTE — Telephone Encounter (Signed)
New problem   Pt want to know does he need to keep taking double dosage of beta blocker b/c it's making him feel bad. Please advise.

## 2014-03-30 NOTE — Telephone Encounter (Signed)
Beta blocker is causing him to have an increased heart rate.  Heart rate monitor shows a rate from mid 70's to 100 or so with normal activity. Has felt tremendously worse.  Has felt disabled having to walk really slow because it was causing his heart to beat really hard.  States his episodes of feeling like he is going to pass out has increased instead of decreasing or resolving.  Pt states he has decreased his Toprol to 50 mg on his own (last night) and feels some better today. Advised pt to continue on the 50 mg at bedtime.  His appt was moved up to 11/27 with Dr Anne FuSkains.  He is aware I will discuss with Dr Anne FuSkains tomorrow and call him back with any other recommendations or changes. He is agreeable.

## 2014-03-31 NOTE — Telephone Encounter (Signed)
Discussed. Ok with decreased toprol. Will be checking ECHO with valsalva. Consider ETT (looking for BP drop). ? Fludrocortisone. Has appt next Friday.   [name], David Sider, MD

## 2014-03-31 NOTE — Telephone Encounter (Signed)
Spoke with pt- he says he is feeling OK today.  Aware Dr Anne FuSkains will discuss options at his appt next Friday.  Pt will call back prior to his appt if he has problems.

## 2014-04-08 ENCOUNTER — Encounter: Payer: Self-pay | Admitting: Cardiology

## 2014-04-08 ENCOUNTER — Ambulatory Visit (INDEPENDENT_AMBULATORY_CARE_PROVIDER_SITE_OTHER): Payer: BC Managed Care – PPO | Admitting: Cardiology

## 2014-04-08 VITALS — BP 112/82 | HR 75 | Ht 73.0 in | Wt 213.0 lb

## 2014-04-08 DIAGNOSIS — R55 Syncope and collapse: Secondary | ICD-10-CM

## 2014-04-08 DIAGNOSIS — I422 Other hypertrophic cardiomyopathy: Secondary | ICD-10-CM

## 2014-04-08 DIAGNOSIS — Z95810 Presence of automatic (implantable) cardiac defibrillator: Secondary | ICD-10-CM

## 2014-04-08 NOTE — Progress Notes (Signed)
[address] [place], [place]  [address] Phone: [phone] Fax:  [phone]  Date:  04/08/2014   ID:  David Booker, DOB 1975-08-31, MRN [id number]  PCP:  Hillis Range, MD   History of Present Illness: David Booker is a 38 y.o. male Status post ICD implant, hypertrophic cardiomyopathy, doing well area. He can use a magnet to trigger a captured electrocardiogram if he has further syncope according to Dr. Jenel Lucks note. Dr. [name] was suspicious that he may be having increased outflow gradient with sudden exertion as the cause of syncope. Interestingly, after increasing his Toprol all the way to 100 mg, his heart rate actually increased and he felt worse. He cut back his Toprol to 50 mg and he is feeling her but still not at status quo. On 04/08/14 we decided to decrease down to 25 and check echocardiogram with Valsalva.  He's had several prior episodes of syncope one time while [place] briskly [place] a ferry, episodes usually asked 15 seconds, pounding slow.Holter monitor captured nonsustained VT which was asymptomatic. Could've been neurocardiogenic in etiology with cardioinhibitory response captured on of that monitor. After another event, an ICD was placed. MRI showed marked asymmetric hypertrophy of the mid septum of 20 mm similar to his father's pattern. His father was recently diagnosed with hypertrophic cardiomyopathy after VT arrest and subsequently had ICD implanted.  Despite the increase in Toprol to 50 mg, he is still having symptoms of near syncope when initiating exercise.  Wt Readings from Last 3 Encounters:  04/08/14 213 lb (96.616 kg)  03/18/14 211 lb (95.709 kg)  02/07/14 207 lb (93.895 kg)     Past Medical History  Diagnosis Date  . Anxiety   . Palpitations   . Syncope   . Back pain     without radiation  . Genital HSV   . Tachycardia, unspecified   . Syncope and collapse   . Depressive disorder, not elsewhere classified   .  Other specified circulatory system disorders   . Allergic rhinitis, cause unspecified   . Viral syndrome   . Abnormal heart rhythm   . Heartburn     Long Hx  . Substernal chest pain 04/22/13    Pt reports having vague symptoms of substernal CP, different from heartburn in that it is dull & radiatesto the back. Occurring at least 3 episodes over past several weeks. Pain free in between episodes. (04/22/13 Dr. Carman Ching GI)   . Esophageal reflux     Chronic but his current pain appears to be different than his radiating to the back. Possible biliary disease. (04/22/13 Dr. Carman Ching, GI)  . Wide QRS ventricular tachycardia     Noted on event monitor (04/20/12-05/20/12) at night  . Bradycardia     Noted on event monitor (04/20/12-05/20/12) Just before bradycardia was recorded, HR was approx. 130bpm  . Near syncope     While wearing event monitor (04/20/12-05/20/12) 46bpm. Also had chest tightness at this time. Possible vagal (Skains 05/22/12)  . PVC (premature ventricular contraction)     Noted on event monitor (04/20/12-05/20/12)     Past Surgical History  Procedure Laterality Date  . Shoulder surgery Right     Dislocated  . Cardiac defibrillator placement  11/04/13    SJM Fortify Assura DR implanted by Dr [name] for hypertrophic CM and syncope    Current Outpatient Prescriptions  Medication Sig Dispense Refill  . ALPRAZolam (XANAX) 0.5 MG tablet Take 0.5 mg  by mouth daily as needed for anxiety.     Marland Kitchen. escitalopram (LEXAPRO) 20 MG tablet Take 20 mg by mouth daily.     . metoprolol succinate (TOPROL-XL) 50 MG 24 hr tablet Take 50 mg by mouth daily. Take with or immediately following a meal.    . Omeprazole Magnesium (PRILOSEC OTC PO) Take 1 tablet by mouth daily.     . valACYclovir (VALTREX) 500 MG tablet Take 500 mg by mouth daily.      No current facility-administered medications for this visit.    Allergies:   No Known Allergies  Social History:  The patient  reports that  he has never smoked. He does not have any smokeless tobacco history on file. He reports that he drinks alcohol. He reports that he does not use illicit drugs.   Family History  Problem Relation Age of Onset  . CAD Paternal Uncle     paternal uncle died suddenly, per pt autopsy confirmed MI  . Breast cancer Mother [id number]  . Hiatal hernia Father   . Cardiomyopathy Father     Hypertrophic cardiomyopathy HOCM  . Anxiety disorder Sister   . Heart disease Paternal Uncle     Has a VAD and IDC  . Cardiomyopathy Paternal Uncle     Hypertrophic cardiomyopathy HOCM  . Diabetes Paternal Grandmother     Hypertrophic cardiomyopathy  . Diabetes Paternal Grandfather   . Cardiomyopathy Paternal Grandfather     Hypertrophic cardiomyopathy HOCM  . Heart murmur Sister   . GER disease Father   . Supraventricular tachycardia Father   . Heart attack Paternal Uncle 51  . Diabetes Paternal Uncle   . Cardiomyopathy Cousin     hypertrophic cardiomyopathy  . Heart disease Cousin     mitral valve repair and heart ablation surgery  . Macular degeneration Maternal Grandmother   . Scoliosis Mother   . Osteoporosis Mother   . Marfan syndrome Maternal Aunt   . Scoliosis Maternal Aunt   . Mitral valve prolapse Maternal Aunt   . Mitral valve prolapse Maternal Uncle   . Other Maternal Uncle     connective tissue issues  . Mitral valve prolapse Cousin   . Other Cousin     thoracic output surgery  . Other Cousin     thoracic output surgery    ROS:  Please see the history of present illness.   Denies any fevers, chills, orthopnea, PND   All other systems reviewed and negative.   PHYSICAL EXAM: VS:  BP 112/82 mmHg  Pulse 75  Ht 6\' 1"  (1.854 m)  Wt 213 lb (96.616 kg)  BMI 28.11 kg/m2 Well nourished, well developed, in no acute distresspleasant HEENT: normal, [place]/AT, EOMI Neck: no JVD, normal carotid upstroke, no bruit Cardiac:  normal S1, S2; RRR; 1/6 systolic ejection murmur Lungs:  clear to auscultation  bilaterally, no wheezing, rhonchi or rales Abd: soft, nontender, no hepatomegaly, no bruits Ext: no edema, 2+ distal pulses Skin: warm and dry GU: deferred Neuro: no focal abnormalities noted, AAO x 3  EKG:  None today Cardiac MRI - 20mm septum  ASSESSMENT AND PLAN:  1. Hypertrophic cardiomyopathy-he is still having symptoms of near syncope when starting out exercising, difficult to murmur heard on exam today, with and without Valsalva. Interestingly, he felt worse on his Toprol increase and his heart rate increased stated. I will decrease down to 25 mg. I will check an echocardiogram with and without Valsalva to further illustrate his potential outflow tract gradient  or lack thereof. Liberalize salt, fluids. Would fludrocortisone help? Defibrillator in place. Father had VT arrest. 2. ICD-doing well. Dr. Johney FrameAllred. 3. syncope- Liberalizing fluids. Still could be cardioinhibitory, neurocardiogenic.  Signed, Donato SchultzMark Skains, MD [facility]  04/08/2014 9:27 AM

## 2014-04-08 NOTE — Patient Instructions (Signed)
Please decrease your Toprol to 25 mg a day. Continue all other medications as listed.  Your physician has requested that you have an echocardiogram. Echocardiography is a painless test that uses sound waves to create images of your heart. It provides your doctor with information about the size and shape of your heart and how well your heart's chambers and valves are working. This procedure takes approximately one hour. There are no restrictions for this procedure.  Follow up with Dr Anne FuSkains 2 weeks after your echo.

## 2014-04-12 ENCOUNTER — Ambulatory Visit (HOSPITAL_COMMUNITY): Payer: BC Managed Care – PPO | Attending: Cardiology

## 2014-04-12 DIAGNOSIS — I421 Obstructive hypertrophic cardiomyopathy: Secondary | ICD-10-CM

## 2014-04-12 DIAGNOSIS — I422 Other hypertrophic cardiomyopathy: Secondary | ICD-10-CM | POA: Insufficient documentation

## 2014-04-12 DIAGNOSIS — R55 Syncope and collapse: Secondary | ICD-10-CM | POA: Diagnosis not present

## 2014-04-12 DIAGNOSIS — Z95810 Presence of automatic (implantable) cardiac defibrillator: Secondary | ICD-10-CM | POA: Insufficient documentation

## 2014-04-12 NOTE — Progress Notes (Signed)
2D Echo completed. 04/12/2014 

## 2014-04-13 ENCOUNTER — Other Ambulatory Visit: Payer: Self-pay | Admitting: *Deleted

## 2014-04-13 MED ORDER — VERAPAMIL HCL ER 180 MG PO CP24: 180.0000 mg | ORAL_CAPSULE | Freq: Every day | ORAL | Status: DC

## 2014-04-18 ENCOUNTER — Ambulatory Visit: Payer: BC Managed Care – PPO | Admitting: Cardiology

## 2014-04-21 ENCOUNTER — Encounter (HOSPITAL_COMMUNITY): Payer: Self-pay | Admitting: Internal Medicine

## 2014-04-26 ENCOUNTER — Encounter: Payer: Self-pay | Admitting: Cardiology

## 2014-04-26 ENCOUNTER — Ambulatory Visit (INDEPENDENT_AMBULATORY_CARE_PROVIDER_SITE_OTHER): Payer: BC Managed Care – PPO | Admitting: Cardiology

## 2014-04-26 VITALS — BP 118/86 | HR 76 | Ht 73.0 in | Wt 215.0 lb

## 2014-04-26 DIAGNOSIS — Z95810 Presence of automatic (implantable) cardiac defibrillator: Secondary | ICD-10-CM

## 2014-04-26 DIAGNOSIS — I422 Other hypertrophic cardiomyopathy: Secondary | ICD-10-CM

## 2014-04-26 DIAGNOSIS — R55 Syncope and collapse: Secondary | ICD-10-CM

## 2014-04-26 MED ORDER — VERAPAMIL HCL ER 180 MG PO CP24: 180.0000 mg | ORAL_CAPSULE | Freq: Two times a day (BID) | ORAL | Status: DC

## 2014-04-26 NOTE — Patient Instructions (Signed)
Please increase Verapamil to 180 mg twice a day. Continue all other medications as listed.  Follow up in 2 to 4 weeks.  Thank you for choosing [facility]!!

## 2014-04-26 NOTE — Progress Notes (Addendum)
[address] [place], [place]  [address] Phone: [phone] Fax:  [phone]  Date:  04/26/2014   ID:  David Booker, DOB 06/25/1975, MRN [id number]  PCP:  David Range, MD   History of Present Illness: David Booker is a 38 y.o. male Status post ICD implant, hypertrophic cardiomyopathy (Father as well). Dr. Johney Booker was suspicious that he may be having increased outflow gradient with sudden exertion as the cause of syncope. Interestingly, after increasing his Toprol up to 100 mg, his heart rate actually increased and he felt worse. He cut back his Toprol to 50 mg and he is feeling her but still not at status quo. On 04/08/14 we decided to decrease down to 25 and check echocardiogram with Valsalva. On 04/26/14-we had stopped his Toprol altogether, restarted verapamil 180 mg once a day.  He's had several prior episodes of syncope one time while walking briskly to catch a ferry, episodes usually asked 15 seconds, pounding slow.Holter monitor captured nonsustained VT which was asymptomatic. Could've been neurocardiogenic in etiology with cardioinhibitory response captured on of that monitor. After another event, an ICD was placed. MRI showed marked asymmetric hypertrophy of the mid septum of 20 mm similar to his father's pattern. His father was recently diagnosed with hypertrophic cardiomyopathy after VT arrest and subsequently had ICD implanted. Dr. Modesto Booker was seen in consult.    Wt Readings from Last 3 Encounters:  04/26/14 215 lb (97.523 kg)  04/08/14 213 lb (96.616 kg)  03/18/14 211 lb (95.709 kg)     Past Medical History  Diagnosis Date  . Anxiety   . Palpitations   . Syncope   . Back pain     without radiation  . Genital HSV   . Tachycardia, unspecified   . Syncope and collapse   . Depressive disorder, not elsewhere classified   . Other specified circulatory system disorders   . Allergic rhinitis, cause unspecified   . Viral syndrome   . Abnormal  heart rhythm   . Heartburn     Long Hx  . Substernal chest pain 04/22/13    Pt reports having vague symptoms of substernal CP, different from heartburn in that it is dull & radiatesto the back. Occurring at least 3 episodes over past several weeks. Pain free in between episodes. (04/22/13 Dr. Carman Ching GI)   . Esophageal reflux     Chronic but his current pain appears to be different than his radiating to the back. Possible biliary disease. (04/22/13 Dr. Carman Ching, GI)  . Wide QRS ventricular tachycardia     Noted on event monitor (04/20/12-05/20/12) at night  . Bradycardia     Noted on event monitor (04/20/12-05/20/12) Just before bradycardia was recorded, HR was approx. 130bpm  . Near syncope     While wearing event monitor (04/20/12-05/20/12) 46bpm. Also had chest tightness at this time. Possible vagal (David Booker 05/22/12)  . PVC (premature ventricular contraction)     Noted on event monitor (04/20/12-05/20/12)     Past Surgical History  Procedure Laterality Date  . Shoulder surgery Right     Dislocated  . Cardiac defibrillator placement  11/04/13    SJM Fortify Assura DR implanted by Dr David Booker for hypertrophic CM and syncope  . Implantable cardioverter defibrillator implant N/A 11/04/2013    Procedure: IMPLANTABLE CARDIOVERTER DEFIBRILLATOR IMPLANT;  Surgeon: Gardiner Rhyme, MD;  Location: [facility] CATH LAB;  Service: Cardiovascular;  Laterality: N/A;    Current Outpatient Prescriptions  Medication  Sig Dispense Refill  . ALPRAZolam (XANAX) 0.5 MG tablet Take 0.5 mg by mouth daily as needed for anxiety.     Marland Kitchen. escitalopram (LEXAPRO) 20 MG tablet Take 20 mg by mouth daily.     . Omeprazole Magnesium (PRILOSEC OTC PO) Take 1 tablet by mouth daily.     . valACYclovir (VALTREX) 500 MG tablet Take 500 mg by mouth daily.     . verapamil (VERELAN PM) 180 MG 24 hr capsule Take 1 capsule (180 mg total) by mouth at bedtime. 30 capsule 6   No current facility-administered medications for this  visit.    Allergies:   No Known Allergies  Social History:  The patient  reports that he has never smoked. He does not have any smokeless tobacco history on file. He reports that he drinks alcohol. He reports that he does not use illicit drugs.   Family History  Problem Relation Age of Onset  . CAD Paternal Uncle     paternal uncle died suddenly, per pt autopsy confirmed MI  . Breast cancer Mother [id number]  . Hiatal hernia Father   . Cardiomyopathy Father     Hypertrophic cardiomyopathy HOCM  . Anxiety disorder Sister   . Heart disease Paternal Uncle     Has a VAD and IDC  . Cardiomyopathy Paternal Uncle     Hypertrophic cardiomyopathy HOCM  . Diabetes Paternal Grandmother     Hypertrophic cardiomyopathy  . Diabetes Paternal Grandfather   . Cardiomyopathy Paternal Grandfather     Hypertrophic cardiomyopathy HOCM  . Heart murmur Sister   . GER disease Father   . Supraventricular tachycardia Father   . Heart attack Paternal Uncle 51  . Diabetes Paternal Uncle   . Cardiomyopathy Cousin     hypertrophic cardiomyopathy  . Heart disease Cousin     mitral valve repair and heart ablation surgery  . Macular degeneration Maternal Grandmother   . Scoliosis Mother   . Osteoporosis Mother   . Marfan syndrome Maternal Aunt   . Scoliosis Maternal Aunt   . Mitral valve prolapse Maternal Aunt   . Mitral valve prolapse Maternal Uncle   . Other Maternal Uncle     connective tissue issues  . Mitral valve prolapse Cousin   . Other Cousin     thoracic output surgery  . Other Cousin     thoracic output surgery    ROS:  Please see the history of present illness.   Denies any fevers, chills, orthopnea, PND   All other systems reviewed and negative.   PHYSICAL EXAM: VS:  BP 118/86 mmHg  Pulse 76  Ht 6\' 1"  (1.854 m)  Wt 215 lb (97.523 kg)  BMI 28.37 kg/m2 Well nourished, well developed, in no acute distresspleasant HEENT: normal, [place]/AT, EOMI Neck: no JVD, normal carotid upstroke, no  bruit Cardiac:  normal S1, S2; RRR; 1/6 systolic ejection murmur Lungs:  clear to auscultation bilaterally, no wheezing, rhonchi or rales Abd: soft, nontender, no hepatomegaly, no bruits Ext: no edema, 2+ distal pulses Skin: warm and dry GU: deferred Neuro: no focal abnormalities noted, AAO x 3  EKG:  None today Cardiac MRI - 20mm septum Echocardiogram 04/12/14-with Valsalva, for tract gradient noted. Sam, 49 mmHg gradient.  ASSESSMENT AND PLAN:  1. Hypertrophic cardiomyopathy-he is still having symptoms of near syncope when starting out exercising but these are improved on the verapamil.  Interestingly, he felt worse on his Toprol increase and his heart rate increased stated. I decided to switch him  from Toprol over to verapamil to see how this helped. He feels as though he is now at a baseline. I will increase wrap and the to 180 mg twice a day. He remembers that approximately 2 years ago verapamil was utilized but it did not make a significant difference. Now may be a different situation. [facility]. Liberalize salt, fluids. Defibrillator in place. His MRI had same confirmation as father. Father had VT arrest. 2. ICD-doing well. Dr. Johney FrameAllred. 3. syncope- Liberalizing fluids. Still could be partly cardioinhibitory, neurocardiogenic. For now, focusing on outflow tract. 4. We will see back in 2-4 weeks.  Signed, Donato SchultzMark Avaree Gilberti, MD [facility]  04/26/2014 10:26 AM

## 2014-05-11 ENCOUNTER — Encounter: Payer: Self-pay | Admitting: Internal Medicine

## 2014-05-11 ENCOUNTER — Ambulatory Visit (INDEPENDENT_AMBULATORY_CARE_PROVIDER_SITE_OTHER): Payer: BC Managed Care – PPO | Admitting: *Deleted

## 2014-05-11 DIAGNOSIS — I422 Other hypertrophic cardiomyopathy: Secondary | ICD-10-CM

## 2014-05-11 LAB — MDC_IDC_ENUM_SESS_TYPE_REMOTE
Battery Remaining Longevity: 86 mo
Battery Remaining Percentage: 92 %
Battery Voltage: 3.14 V
Brady Statistic AP VS Percent: 12 %
Brady Statistic AS VP Percent: 1 %
Brady Statistic RA Percent Paced: 13 %
Brady Statistic RV Percent Paced: 1 %
Date Time Interrogation Session: 20151230072308
HIGH POWER IMPEDANCE MEASURED VALUE: 71 Ohm
HighPow Impedance: 71 Ohm
Implantable Pulse Generator Serial Number: 7199672
Lead Channel Impedance Value: 460 Ohm
Lead Channel Sensing Intrinsic Amplitude: 12 mV
Lead Channel Sensing Intrinsic Amplitude: 4.3 mV
Lead Channel Setting Pacing Amplitude: 2 V
Lead Channel Setting Pacing Pulse Width: 0.5 ms
MDC IDC MSMT LEADCHNL RV IMPEDANCE VALUE: 650 Ohm
MDC IDC SET LEADCHNL RV PACING AMPLITUDE: 2.5 V
MDC IDC SET LEADCHNL RV SENSING SENSITIVITY: 0.5 mV
MDC IDC STAT BRADY AP VP PERCENT: 1 %
MDC IDC STAT BRADY AS VS PERCENT: 87 %
Zone Setting Detection Interval: 250 ms
Zone Setting Detection Interval: 300 ms
Zone Setting Detection Interval: 330 ms

## 2014-05-11 NOTE — Progress Notes (Signed)
Remote ICD transmission.   

## 2014-05-20 ENCOUNTER — Encounter: Payer: Self-pay | Admitting: *Deleted

## 2014-05-20 ENCOUNTER — Ambulatory Visit (INDEPENDENT_AMBULATORY_CARE_PROVIDER_SITE_OTHER): Payer: BC Managed Care – PPO | Admitting: Cardiology

## 2014-05-20 ENCOUNTER — Encounter: Payer: Self-pay | Admitting: Cardiology

## 2014-05-20 VITALS — BP 130/78 | HR 76 | Ht 73.0 in | Wt 218.0 lb

## 2014-05-20 DIAGNOSIS — I422 Other hypertrophic cardiomyopathy: Secondary | ICD-10-CM

## 2014-05-20 DIAGNOSIS — R55 Syncope and collapse: Secondary | ICD-10-CM

## 2014-05-20 MED ORDER — METOPROLOL SUCCINATE ER 25 MG PO TB24: 25.0000 mg | ORAL_TABLET | Freq: Every day | ORAL | Status: DC

## 2014-05-20 NOTE — Progress Notes (Signed)
[address] [place], [place]  [address] Phone: [phone] Fax:  [phone]  Date:  05/20/2014   ID:  David Booker, DOB April 01, 1976, MRN [id number]  PCP:  Hillis Range, MD   History of Present Illness: Roth Ress is a 39 y.o. male Status post ICD implant, hypertrophic cardiomyopathy (Father as well). Dr. [name] was suspicious that he may be having increased outflow gradient with sudden exertion as the cause of syncope. Interestingly, after increasing his Toprol up to 100 mg, his heart rate actually increased and he felt worse. He cut back his Toprol to 50 mg and he is feeling her but still not at status quo. On 04/08/14 we decided to decrease down to 25 and check echocardiogram with Valsalva. On 04/26/14-we had stopped his Toprol altogether, restarted verapamil 180 mg once a day.  He's had several prior episodes of syncope one time while walking briskly to catch a ferry, episodes usually asked 15 seconds, pounding slow.Holter monitor captured nonsustained VT which was asymptomatic. Could've been neurocardiogenic in etiology with cardioinhibitory response captured on of that monitor. After another event, an ICD was placed. MRI showed marked asymmetric hypertrophy of the mid septum of 20 mm similar to his father's pattern. His father was recently diagnosed with hypertrophic cardiomyopathy after VT arrest and subsequently had ICD implanted. Dr. Modesto Charon was seen in consult.   05/20/14 - feels "weird beats" but does not feel near syncope as much. Has iPhone app Alive[employer]G. This showed a PVC every 10th beat or so when he was feeling them. Decreased sensation of near syncope with verapamil however. Overall feels better. Wonders if starting low-dose Toprol would help with palpitations. Heart rate noted to be in the 70s to 80s.   Wt Readings from Last 3 Encounters:  05/20/14 218 lb (98.884 kg)  04/26/14 215 lb (97.523 kg)  04/08/14 213 lb (96.616 kg)     Past Medical  History  Diagnosis Date  . Anxiety   . Palpitations   . Syncope   . Back pain     without radiation  . Genital HSV   . Tachycardia, unspecified   . Syncope and collapse   . Depressive disorder, not elsewhere classified   . Other specified circulatory system disorders   . Allergic rhinitis, cause unspecified   . Viral syndrome   . Abnormal heart rhythm   . Heartburn     Long Hx  . Substernal chest pain 04/22/13    Pt reports having vague symptoms of substernal CP, different from heartburn in that it is dull & radiatesto the back. Occurring at least 3 episodes over past several weeks. Pain free in between episodes. (04/22/13 Dr. Carman Ching GI)   . Esophageal reflux     Chronic but his current pain appears to be different than his radiating to the back. Possible biliary disease. (04/22/13 Dr. Carman Ching, GI)  . Wide QRS ventricular tachycardia     Noted on event monitor (04/20/12-05/20/12) at night  . Bradycardia     Noted on event monitor (04/20/12-05/20/12) Just before bradycardia was recorded, HR was approx. 130bpm  . Near syncope     While wearing event monitor (04/20/12-05/20/12) 46bpm. Also had chest tightness at this time. Possible vagal (Konrad Hoak 05/22/12)  . PVC (premature ventricular contraction)     Noted on event monitor (04/20/12-05/20/12)     Past Surgical History  Procedure Laterality Date  . Shoulder surgery Right     Dislocated  .  Cardiac defibrillator placement  11/04/13    SJM Fortify Assura DR implanted by Dr Johney FrameAllred for hypertrophic CM and syncope  . Implantable cardioverter defibrillator implant N/A 11/04/2013    Procedure: IMPLANTABLE CARDIOVERTER DEFIBRILLATOR IMPLANT;  Surgeon: Gardiner RhymeJames D Allred, MD;  [facility];  Service: Cardiovascular;  Laterality: N/A;    Current Outpatient Prescriptions  Medication Sig Dispense Refill  . ALPRAZolam (XANAX) 0.5 MG tablet Take 0.5 mg by mouth daily as needed for anxiety.     Marland Kitchen. escitalopram (LEXAPRO) 20 MG  tablet Take 20 mg by mouth daily.     . Omeprazole Magnesium (PRILOSEC OTC PO) Take 1 tablet by mouth daily.     . valACYclovir (VALTREX) 500 MG tablet Take 500 mg by mouth daily.     . verapamil (VERELAN PM) 180 MG 24 hr capsule Take 1 capsule (180 mg total) by mouth 2 (two) times daily. 60 capsule 6   No current facility-administered medications for this visit.    Allergies:   No Known Allergies  Social History:  The patient  reports that he has never smoked. He does not have any smokeless tobacco history on file. He reports that he drinks alcohol. He reports that he does not use illicit drugs.   Family History  Problem Relation Age of Onset  . CAD Paternal Uncle     paternal uncle died suddenly, per pt autopsy confirmed MI  . Breast cancer Mother [id number]  . Hiatal hernia Father   . Cardiomyopathy Father     Hypertrophic cardiomyopathy HOCM  . Anxiety disorder Sister   . Heart disease Paternal Uncle     Has a VAD and IDC  . Cardiomyopathy Paternal Uncle     Hypertrophic cardiomyopathy HOCM  . Diabetes Paternal Grandmother     Hypertrophic cardiomyopathy  . Diabetes Paternal Grandfather   . Cardiomyopathy Paternal Grandfather     Hypertrophic cardiomyopathy HOCM  . Heart murmur Sister   . GER disease Father   . Supraventricular tachycardia Father   . Heart attack Paternal Uncle 51  . Diabetes Paternal Uncle   . Cardiomyopathy Cousin     hypertrophic cardiomyopathy  . Heart disease Cousin     mitral valve repair and heart ablation surgery  . Macular degeneration Maternal Grandmother   . Scoliosis Mother   . Osteoporosis Mother   . Marfan syndrome Maternal Aunt   . Scoliosis Maternal Aunt   . Mitral valve prolapse Maternal Aunt   . Mitral valve prolapse Maternal Uncle   . Other Maternal Uncle     connective tissue issues  . Mitral valve prolapse Cousin   . Other Cousin     thoracic output surgery  . Other Cousin     thoracic output surgery    ROS:  Please see the  history of present illness.   Denies any fevers, chills, orthopnea, PND   All other systems reviewed and negative.   PHYSICAL EXAM: VS:  BP 130/78 mmHg  Pulse 76  Ht 6\' 1"  (1.854 m)  Wt 218 lb (98.884 kg)  BMI 28.77 kg/m2 Well nourished, well developed, in no acute distresspleasant HEENT: normal, [place]/AT, EOMI Neck: no JVD, normal carotid upstroke, no bruit Cardiac:  normal S1, S2; RRR; 1/6 systolic musical ejection murmur Lungs:  clear to auscultation bilaterally, no wheezing, rhonchi or rales Abd: soft, nontender, no hepatomegaly, no bruits Ext: no edema, 2+ distal pulses Skin: warm and dry GU: deferred Neuro: no focal abnormalities noted, AAO x 3  EKG:  None today Cardiac MRI - 20mm septum Echocardiogram 04/12/14-with Valsalva, outflow gradient noted. SAM, 49 mmHg gradient.  ASSESSMENT AND PLAN:  1. Hypertrophic cardiomyopathy-His symptoms have improved with verapamil 180 twice a day. He is not having as frequent a feeling of near syncope when starting a vigorous task. He does however still feel occasional palpitations which she thinks was better controlled with the metoprolol. He shows me tracings on his phone of PVCs which she detected from a $60 app called Alive[employer]G with [employer]. We will try low-dose Toprol-XL 25 mg which he has taken in the past. We'll see if this combination helps. Overall he is pleased with progress. He has seen Dr. Modesto Charon in consultation at [facility] prior to his ICD placement. Liberalize salt, fluids. Defibrillator in place. His MRI had same confirmation as father. Father had VT arrest. 2. ICD-doing well. Dr. [name]. 3. syncope- Liberalizing fluids. Still could be partly cardioinhibitory, neurocardiogenic. For now, focusing on outflow tract. 4. We will see back in 4 months.  Signed, Donato Schultz, MD [facility]  05/20/2014 10:46 AM

## 2014-05-20 NOTE — Patient Instructions (Signed)
Please restart Metoprolol 25 mg once a day to see if this helps with your palpitations. Continue all other medications as listed.  Follow up in 4 months with Dr. Anne FuSkains.  You will receive a letter in the mail 2 months before you are due.  Please call us when you receive this letter to schedule your follow up appointment.  Thank you for choosing [facility]!!

## 2014-08-10 ENCOUNTER — Ambulatory Visit (INDEPENDENT_AMBULATORY_CARE_PROVIDER_SITE_OTHER): Payer: BC Managed Care – PPO | Admitting: *Deleted

## 2014-08-10 DIAGNOSIS — I422 Other hypertrophic cardiomyopathy: Secondary | ICD-10-CM

## 2014-08-10 NOTE — Progress Notes (Signed)
Remote ICD transmission.   

## 2014-08-11 ENCOUNTER — Encounter: Payer: Self-pay | Admitting: Internal Medicine

## 2014-08-11 LAB — MDC_IDC_ENUM_SESS_TYPE_REMOTE
Brady Statistic RA Percent Paced: 23 %
HighPow Impedance: 77 Ohm
Lead Channel Impedance Value: 550 Ohm
Lead Channel Sensing Intrinsic Amplitude: 12 mV
Lead Channel Sensing Intrinsic Amplitude: 5 mV
Lead Channel Setting Pacing Amplitude: 2 V
Lead Channel Setting Pacing Amplitude: 2.5 V
Lead Channel Setting Pacing Pulse Width: 0.5 ms
MDC IDC MSMT LEADCHNL RA IMPEDANCE VALUE: 490 Ohm
MDC IDC PG SERIAL: 7199672
MDC IDC SET LEADCHNL RV SENSING SENSITIVITY: 0.5 mV
MDC IDC SET ZONE DETECTION INTERVAL: 250 ms
MDC IDC SET ZONE DETECTION INTERVAL: 300 ms
MDC IDC SET ZONE DETECTION INTERVAL: 330 ms
MDC IDC STAT BRADY RV PERCENT PACED: 1.2 %

## 2014-08-18 ENCOUNTER — Encounter: Payer: Self-pay | Admitting: Cardiology

## 2014-08-24 ENCOUNTER — Ambulatory Visit (INDEPENDENT_AMBULATORY_CARE_PROVIDER_SITE_OTHER): Payer: BLUE CROSS/BLUE SHIELD | Admitting: Cardiology

## 2014-08-24 ENCOUNTER — Encounter: Payer: Self-pay | Admitting: Cardiology

## 2014-08-24 VITALS — BP 120/92 | HR 80 | Ht 73.0 in | Wt 219.4 lb

## 2014-08-24 DIAGNOSIS — R55 Syncope and collapse: Secondary | ICD-10-CM | POA: Diagnosis not present

## 2014-08-24 DIAGNOSIS — Z95810 Presence of automatic (implantable) cardiac defibrillator: Secondary | ICD-10-CM | POA: Diagnosis not present

## 2014-08-24 DIAGNOSIS — I422 Other hypertrophic cardiomyopathy: Secondary | ICD-10-CM

## 2014-08-24 NOTE — Progress Notes (Signed)
[address], [address] [place], [place]  [address] Phone: [phone] Fax:  [phone]  Date:  08/24/2014   ID:  David Booker, DOB 1975/12/04, MRN [id number]  PCP:  Hillis Range, MD   History of Present Illness: David Booker is a 39 y.o. male Status post ICD implant, hypertrophic cardiomyopathy (Father as well). Dr. [name] was suspicious that he may be having increased outflow gradient with sudden exertion as the cause of syncope. Interestingly, after increasing his Toprol up to 100 mg, his heart rate actually increased and he felt worse. He cut back his Toprol to 50 mg and he is feeling her but still not at status quo. On 04/08/14 we decided to decrease down to 25 and check echocardiogram with Valsalva. On 04/26/14-we had stopped his Toprol altogether, restarted verapamil 180 mg once a day.  He's had several prior episodes of syncope one time while walking briskly to catch a ferry, episodes usually asked 15 seconds, pounding slow.Holter monitor captured nonsustained VT which was asymptomatic. Could've been neurocardiogenic in etiology with cardioinhibitory response captured on of that monitor. After another event, an ICD was placed. MRI showed marked asymmetric hypertrophy of the mid septum of 20 mm similar to his father's pattern. His father was recently diagnosed with hypertrophic cardiomyopathy after VT arrest and subsequently had ICD implanted. Dr. Modesto Charon was seen in consult.   05/20/14 - feels "weird beats" but does not feel near syncope as much. Has iPhone app [employer]. This showed a PVC every 10th beat or so when he was feeling them. Decreased sensation of near syncope with verapamil however. Overall feels better. Wonders if starting low-dose Toprol would help with palpitations. Heart rate noted to be in the 70s to 80s.  08/24/14 - syncope getting off airplane. Walking off jet way. Felt poor all day prior. Usually know when this is going to happen. Very consistent with  prior episode. Start mild exertion. Immediately afterward, felt better immedialtly after. Like a reset button. Did not hurt himself. Stopped all of his medications. Feels no different heart wise. Much better.    Wt Readings from Last 3 Encounters:  08/24/14 219 lb 6.4 oz (99.519 kg)  05/20/14 218 lb (98.884 kg)  04/26/14 215 lb (97.523 kg)     Past Medical History  Diagnosis Date  . Anxiety   . Palpitations   . Syncope   . Back pain     without radiation  . Genital HSV   . Tachycardia, unspecified   . Syncope and collapse   . Depressive disorder, not elsewhere classified   . Other specified circulatory system disorders   . Allergic rhinitis, cause unspecified   . Viral syndrome   . Abnormal heart rhythm   . Heartburn     Long Hx  . Substernal chest pain 04/22/13    Pt reports having vague symptoms of substernal CP, different from heartburn in that it is dull & radiatesto the back. Occurring at least 3 episodes over past several weeks. Pain free in between episodes. (04/22/13 Dr. Carman Ching GI)   . Esophageal reflux     Chronic but his current pain appears to be different than his radiating to the back. Possible biliary disease. (04/22/13 Dr. Carman Ching, GI)  . Wide QRS ventricular tachycardia     Noted on event monitor (04/20/12-05/20/12) at night  . Bradycardia     Noted on event monitor (04/20/12-05/20/12) Just before bradycardia was recorded, HR was approx. 130bpm  .  Near syncope     While wearing event monitor (04/20/12-05/20/12) 46bpm. Also had chest tightness at this time. Possible vagal (Anija Brickner 05/22/12)  . PVC (premature ventricular contraction)     Noted on event monitor (04/20/12-05/20/12)     Past Surgical History  Procedure Laterality Date  . Shoulder surgery Right     Dislocated  . Cardiac defibrillator placement  11/04/13    SJM Fortify Assura DR implanted by Dr Johney FrameAllred for hypertrophic CM and syncope  . Implantable cardioverter defibrillator implant N/A  11/04/2013    Procedure: IMPLANTABLE CARDIOVERTER DEFIBRILLATOR IMPLANT;  Surgeon: Gardiner RhymeJames D Allred, MD;  [facility];  Service: Cardiovascular;  Laterality: N/A;    Current Outpatient Prescriptions  Medication Sig Dispense Refill  . ALPRAZolam (XANAX) 0.5 MG tablet Take 0.5 mg by mouth daily as needed for anxiety.     . Omeprazole Magnesium (PRILOSEC OTC PO) Take 1 tablet by mouth daily.     . valACYclovir (VALTREX) 500 MG tablet Take 500 mg by mouth daily.      No current facility-administered medications for this visit.    Allergies:   No Known Allergies  Social History:  The patient  reports that he has never smoked. He does not have any smokeless tobacco history on file. He reports that he drinks alcohol. He reports that he does not use illicit drugs.   Family History  Problem Relation Age of Onset  . CAD Paternal Uncle     paternal uncle died suddenly, per pt autopsy confirmed MI  . Breast cancer Mother 654  . Hiatal hernia Father   . Cardiomyopathy Father     Hypertrophic cardiomyopathy HOCM  . Anxiety disorder Sister   . Heart disease Paternal Uncle     Has a VAD and IDC  . Cardiomyopathy Paternal Uncle     Hypertrophic cardiomyopathy HOCM  . Diabetes Paternal Grandmother     Hypertrophic cardiomyopathy  . Diabetes Paternal Grandfather   . Cardiomyopathy Paternal Grandfather     Hypertrophic cardiomyopathy HOCM  . Heart murmur Sister   . GER disease Father   . Supraventricular tachycardia Father   . Heart attack Paternal Uncle 51  . Diabetes Paternal Uncle   . Cardiomyopathy Cousin     hypertrophic cardiomyopathy  . Heart disease Cousin     mitral valve repair and heart ablation surgery  . Macular degeneration Maternal Grandmother   . Scoliosis Mother   . Osteoporosis Mother   . Marfan syndrome Maternal Aunt   . Scoliosis Maternal Aunt   . Mitral valve prolapse Maternal Aunt   . Mitral valve prolapse Maternal Uncle   . Other Maternal Uncle      connective tissue issues  . Mitral valve prolapse Cousin   . Other Cousin     thoracic output surgery  . Other Cousin     thoracic output surgery    ROS:  Please see the history of present illness.   Denies any fevers, chills, orthopnea, PND   All other systems reviewed and negative.   PHYSICAL EXAM: VS:  BP 120/92 mmHg  Pulse 80  Ht 6\' 1"  (1.854 m)  Wt 219 lb 6.4 oz (99.519 kg)  BMI 28.95 kg/m2  SpO2 98% Well nourished, well developed, in no acute distresspleasant HEENT: normal, [place]/AT, EOMI Neck: no JVD, normal carotid upstroke, no bruit Cardiac:  normal S1, S2; RRR; 1/6 systolic musical ejection murmur Lungs:  clear to auscultation bilaterally, no wheezing, rhonchi or rales Abd: soft, nontender, no  hepatomegaly, no bruits Ext: no edema, 2+ distal pulses Skin: warm and dry GU: deferred Neuro: no focal abnormalities noted, AAO x 3  EKG:  None today Cardiac MRI - 20mm septum Echocardiogram 04/12/14-with Valsalva, outflow gradient noted. SAM, 49 mmHg gradient.  ASSESSMENT AND PLAN:  1. Hypertrophic cardiomyopathy/syncope-he had another episode of syncope after getting off of an airplane and walking up jet way. These types of symptoms have been quite intermittent Prodrome was short and brief. He has been quite frustrated with these symptoms. We had lengthy discussion today about potential physiology behind his syncope, height dynamic contraction/Valsalva-like maneuver, systolic anterior motion of mitral valve. We have tried most pharmacologic as well as conservative measures, salt liberalization, aggressive hydration. We have tried beta blocker alone, verapamil alone, combination of both and sometimes his symptoms seemed to improve with these pharmacologic therapies but usually it ends up bleeding to lack of improvement ultimately. He is a bit frustrated that his defibrillator has not been recording these episodes electrically. I explained that it is certainly plausible that he is in  sinus rhythm or sinus tachycardia during these episodes. He states that he is gone out and bought a Holter monitor. He was also instructed by [employer] representative to place a magnet over defibrillator to record electrical activity surrounding syncopal episode. We had discussion about ultimate purpose of defibrillator and if he did have ventricular tachycardia which was leading to his syncopal episode certainly his defibrillator would record these episodes. Once again, his father had VT, uncle died. On his own, he stopped his medications which included verapamil and combination of beta blocker. He states that he has had more energy with this cessation. Because of his lack of improvement and repeat of syncopal episode which happens a couple times a year he states versus once very infrequently in the past, we will have him visit once again with Dr. Modesto Charon for opinion at [facility] on further therapeutic options. We also discussed the possibility of [facility] as another avenue for opinion. Many of his family members have gone there.He has seen Dr. Modesto Charon in consultation at [facility] prior to his ICD placement. Liberalize salt, fluids. Defibrillator in place. His MRI had same confirmation as father. Father had VT arrest. 2. ICD-doing well. Dr. [name]. 3. syncope- Liberalizing fluids. Still could be partly cardioinhibitory, neurocardiogenic. For now, focusing on outflow tract but has tried beta blocker, calcium channel blocker without success. Question would he benefit from dipyridamole older pharmacologic therapy? 4. We will see back in 6 months or sooner based upon repeat visit at [facility].  Signed, Donato Schultz, MD [facility]  08/24/2014 8:33 AM

## 2014-08-24 NOTE — Patient Instructions (Signed)
Your physician recommends that you continue on your current medications as directed. Please refer to the Current Medication list given to you today.  Your physician wants you to follow-up in: 6 months. You will receive a reminder letter in the mail two months in advance. If you don't receive a letter, please call our office to schedule the follow-up appointment.  

## 2014-08-30 ENCOUNTER — Telehealth: Payer: Self-pay | Admitting: Cardiology

## 2014-08-30 NOTE — Telephone Encounter (Signed)
Will forward to Dr Anne FuSkains to see if he thinks calling will help move appt up or if there is someone else he should see.

## 2014-08-30 NOTE — Telephone Encounter (Signed)
appt moved up to 09-15-2014 at 8:40 am.  Pt is aware.  Records taken to MR to be faxed to [phone].

## 2014-08-30 NOTE — Telephone Encounter (Signed)
New message      Pt states Dr Anne FuSkains [facility] Dr [facility]ari ManningWang/Wong.  They cannot see him until 12-08-14.  Can you call to get him in sooner or refer him somewhere else where he can be seen sooner.  Please call

## 2014-08-30 NOTE — Telephone Encounter (Signed)
Please call to see if appt can be sooner. thanks [name], Lashayla Armes, MD

## 2014-09-08 ENCOUNTER — Encounter (HOSPITAL_COMMUNITY): Payer: Self-pay | Admitting: *Deleted

## 2014-09-08 ENCOUNTER — Emergency Department (HOSPITAL_COMMUNITY): Payer: BLUE CROSS/BLUE SHIELD

## 2014-09-08 ENCOUNTER — Emergency Department (HOSPITAL_COMMUNITY)
Admission: EM | Admit: 2014-09-08 | Discharge: 2014-09-08 | Disposition: A | Discharge status: Home / Self Care | Payer: BLUE CROSS/BLUE SHIELD | Attending: Emergency Medicine | Admitting: Emergency Medicine

## 2014-09-08 DIAGNOSIS — K838 Other specified diseases of biliary tract: Secondary | ICD-10-CM | POA: Insufficient documentation

## 2014-09-08 DIAGNOSIS — Z8619 Personal history of other infectious and parasitic diseases: Secondary | ICD-10-CM | POA: Diagnosis not present

## 2014-09-08 DIAGNOSIS — F329 Major depressive disorder, single episode, unspecified: Secondary | ICD-10-CM | POA: Insufficient documentation

## 2014-09-08 DIAGNOSIS — Z79899 Other long term (current) drug therapy: Secondary | ICD-10-CM | POA: Diagnosis not present

## 2014-09-08 DIAGNOSIS — Z8679 Personal history of other diseases of the circulatory system: Secondary | ICD-10-CM | POA: Diagnosis not present

## 2014-09-08 DIAGNOSIS — F419 Anxiety disorder, unspecified: Secondary | ICD-10-CM | POA: Diagnosis not present

## 2014-09-08 DIAGNOSIS — Z95810 Presence of automatic (implantable) cardiac defibrillator: Secondary | ICD-10-CM | POA: Diagnosis not present

## 2014-09-08 DIAGNOSIS — R109 Unspecified abdominal pain: Secondary | ICD-10-CM

## 2014-09-08 DIAGNOSIS — R1011 Right upper quadrant pain: Secondary | ICD-10-CM | POA: Diagnosis present

## 2014-09-08 LAB — COMPREHENSIVE METABOLIC PANEL
ALT: 38 U/L (ref 0–53)
AST: 22 U/L (ref 0–37)
Albumin: 4.4 g/dL (ref 3.5–5.2)
Alkaline Phosphatase: 87 U/L (ref 39–117)
Anion gap: 9 (ref 5–15)
BUN: 14 mg/dL (ref 6–23)
CALCIUM: 9.8 mg/dL (ref 8.4–10.5)
CO2: 29 mmol/L (ref 19–32)
CREATININE: 1.11 mg/dL (ref 0.50–1.35)
Chloride: 103 mmol/L (ref 96–112)
GFR calc Af Amer: >90 mL/min (ref >90)
GFR calc non Af Amer: 81 mL/min — ABNORMAL LOW (ref >90)
Glucose, Bld: 102 mg/dL — ABNORMAL HIGH (ref 70–99)
Potassium: 3.7 mmol/L (ref 3.5–5.1)
Sodium: 141 mmol/L (ref 135–145)
Total Bilirubin: 0.4 mg/dL (ref 0.3–1.2)
Total Protein: 7.4 g/dL (ref 6.0–8.3)

## 2014-09-08 LAB — CBC WITH DIFFERENTIAL/PLATELET
BASOS ABS: 0.1 10*3/uL (ref 0.0–0.1)
BASOS PCT: 1 % (ref 0–1)
Eosinophils Absolute: 0.2 10*3/uL (ref 0.0–0.7)
Eosinophils Relative: 3 % (ref 0–5)
HEMATOCRIT: 42.7 % (ref 39.0–52.0)
HEMOGLOBIN: 15.2 g/dL (ref 13.0–17.0)
Lymphocytes Relative: 33 % (ref 12–46)
Lymphs Abs: 2.8 10*3/uL (ref 0.7–4.0)
MCH: 28.7 pg (ref 26.0–34.0)
MCHC: 35.6 g/dL (ref 30.0–36.0)
MCV: 80.7 fL (ref 78.0–100.0)
MONO ABS: 0.7 10*3/uL (ref 0.1–1.0)
Monocytes Relative: 8 % (ref 3–12)
NEUTROS PCT: 55 % (ref 43–77)
Neutro Abs: 4.8 10*3/uL (ref 1.7–7.7)
Platelets: 288 10*3/uL (ref 150–400)
RBC: 5.29 MIL/uL (ref 4.22–5.81)
RDW: 12.9 % (ref 11.5–15.5)
WBC: 8.6 10*3/uL (ref 4.0–10.5)

## 2014-09-08 LAB — LIPASE, BLOOD: Lipase: 30 U/L (ref 11–59)

## 2014-09-08 MED ORDER — MORPHINE SULFATE 4 MG/ML IJ SOLN
4.0000 mg | Freq: Once | INTRAMUSCULAR | Status: AC
  Administered 2014-09-08: 4 mg via INTRAVENOUS
  Filled 2014-09-08: qty 1

## 2014-09-08 NOTE — ED Provider Notes (Signed)
CSN: [id number]     Arrival date & time 09/08/14  0439 History   First MD Initiated Contact with Patient 09/08/14 [phone]     Chief Complaint  Patient presents with  . Abdominal Pain     (Consider location/radiation/quality/duration/timing/severity/associated sxs/prior Treatment) The history is provided by the patient.   39 year old male comes in with abdominal pain which started about 1 AM. Pain is across the upper abdomen were worse on the right side with occasional radiation to the back. He has been having episodes like this intermittently for several years but it has been worse over the last several weeks. Symptoms are predominantly at night after eating something fatty and typically wake him up. There is no associated nausea or vomiting. He's had chills but no fever or sweats. Pain was as severe as 9/10 but it has subsided somewhat since 6/10. In the past, he has had some relief with antiacids and he is taking over-the-counter omeprazole on a daily basis. He denies constipation or diarrhea. Of note, fatty foods do not bother him if he eats them during the daytime. Also, he is status post implanted defibrillator for hypertrophic myopathy with syncopal episodes. He continues to have syncopal episodes in spite of the defibrillator being implanted.  Past Medical History  Diagnosis Date  . Anxiety   . Palpitations   . Syncope   . Back pain     without radiation  . Genital HSV   . Tachycardia, unspecified   . Syncope and collapse   . Depressive disorder, not elsewhere classified   . Other specified circulatory system disorders   . Allergic rhinitis, cause unspecified   . Viral syndrome   . Abnormal heart rhythm   . Heartburn     Long Hx  . Substernal chest pain 04/22/13    Pt reports having vague symptoms of substernal CP, different from heartburn in that it is dull & radiatesto the back. Occurring at least 3 episodes over past several weeks. Pain free in between episodes. (04/22/13 Dr.  Carman ChingJames Edwards GI)   . Esophageal reflux     Chronic but his current pain appears to be different than his radiating to the back. Possible biliary disease. (04/22/13 Dr. Carman ChingJames Edwards, GI)  . Wide QRS ventricular tachycardia     Noted on event monitor (04/20/12-05/20/12) at night  . Bradycardia     Noted on event monitor (04/20/12-05/20/12) Just before bradycardia was recorded, HR was approx. 130bpm  . Near syncope     While wearing event monitor (04/20/12-05/20/12) 46bpm. Also had chest tightness at this time. Possible vagal (Skains 05/22/12)  . PVC (premature ventricular contraction)     Noted on event monitor (04/20/12-05/20/12)    Past Surgical History  Procedure Laterality Date  . Shoulder surgery Right     Dislocated  . Cardiac defibrillator placement  11/04/13    SJM Fortify Assura DR implanted by Dr Johney FrameAllred for hypertrophic CM and syncope  . Implantable cardioverter defibrillator implant N/A 11/04/2013    Procedure: IMPLANTABLE CARDIOVERTER DEFIBRILLATOR IMPLANT;  Surgeon: Gardiner RhymeJames D Allred, MD;  [facility];  Service: Cardiovascular;  Laterality: N/A;   Family History  Problem Relation Age of Onset  . CAD Paternal Uncle     paternal uncle died suddenly, per pt autopsy confirmed MI  . Breast cancer Mother 1354  . Hiatal hernia Father   . Cardiomyopathy Father     Hypertrophic cardiomyopathy HOCM  . Anxiety disorder Sister   . Heart disease Paternal Uncle  Has a VAD and IDC  . Cardiomyopathy Paternal Uncle     Hypertrophic cardiomyopathy HOCM  . Diabetes Paternal Grandmother     Hypertrophic cardiomyopathy  . Diabetes Paternal Grandfather   . Cardiomyopathy Paternal Grandfather     Hypertrophic cardiomyopathy HOCM  . Heart murmur Sister   . GER disease Father   . Supraventricular tachycardia Father   . Heart attack Paternal Uncle 51  . Diabetes Paternal Uncle   . Cardiomyopathy Cousin     hypertrophic cardiomyopathy  . Heart disease Cousin     mitral valve  repair and heart ablation surgery  . Macular degeneration Maternal Grandmother   . Scoliosis Mother   . Osteoporosis Mother   . Marfan syndrome Maternal Aunt   . Scoliosis Maternal Aunt   . Mitral valve prolapse Maternal Aunt   . Mitral valve prolapse Maternal Uncle   . Other Maternal Uncle     connective tissue issues  . Mitral valve prolapse Cousin   . Other Cousin     thoracic output surgery  . Other Cousin     thoracic output surgery   History  Substance Use Topics  . Smoking status: Never Smoker   . Smokeless tobacco: Not on file  . Alcohol Use: Yes     Comment: 1-2 mixed drinks per day    Review of Systems  All other systems reviewed and are negative.     Allergies  Review of patient's allergies indicates no known allergies.  Home Medications   Prior to Admission medications   Medication Sig Start Date End Date Taking? Authorizing Provider  ALPRAZolam Prudy Feeler) 0.5 MG tablet Take 0.5 mg by mouth daily as needed for anxiety.     Historical Provider, MD  Omeprazole Magnesium (PRILOSEC OTC PO) Take 1 tablet by mouth daily.     Historical Provider, MD  valACYclovir (VALTREX) 500 MG tablet Take 500 mg by mouth daily.  05/04/12   Historical Provider, MD   BP 143/100 mmHg  Pulse 81  Temp(Src) 98.7 F (37.1 C) (Oral)  Resp 18  Ht  (1.854 m)  Wt 213 lb (96.616 kg)  BMI 28.11 kg/m2  SpO2 99% Physical Exam  Nursing note and vitals reviewed.  39 year old male, resting comfortably and in no acute distress. Vital signs are significant for hypertension. Oxygen saturation is 99%, which is normal. Head is normocephalic and atraumatic. PERRLA, EOMI. Oropharynx is clear. Neck is nontender and supple without adenopathy or JVD. Back is nontender and there is no CVA tenderness. Lungs are clear without rales, wheezes, or rhonchi. Chest is nontender. Heart has regular rate and rhythm without murmur. Abdomen is soft, flat, with mild tenderness in the upper abdomen. There  is a negative Murphy sign. There is no rebound or guarding. There are no masses or hepatosplenomegaly and peristalsis is normoactive. Extremities have no cyanosis or edema, full range of motion is present. Skin is warm and dry without rash. Neurologic: Mental status is normal, cranial nerves are intact, there are no motor or sensory deficits.  ED Course  Procedures (including critical care time) Labs Review Results for orders placed or performed during the hospital encounter of 09/08/14  Comprehensive metabolic panel  Result Value Ref Range   Sodium 141 135 - 145 mmol/L   Potassium 3.7 3.5 - 5.1 mmol/L   Chloride 103 96 - 112 mmol/L   CO2 29 19 - 32 mmol/L   Glucose, Bld 102 (H) 70 - 99 mg/dL   BUN 14  6 - 23 mg/dL   Creatinine, Ser 7.82 0.50 - 1.35 mg/dL   Calcium 9.8 8.4 - 95.6 mg/dL   Total Protein 7.4 6.0 - 8.3 g/dL   Albumin 4.4 3.5 - 5.2 g/dL   AST 22 0 - 37 U/L   ALT 38 0 - 53 U/L   Alkaline Phosphatase 87 39 - 117 U/L   Total Bilirubin 0.4 0.3 - 1.2 mg/dL   GFR calc non Af Amer 81 (L) >90 mL/min   GFR calc Af Amer >90 >90 mL/min   Anion gap 9 5 - 15  Lipase, blood  Result Value Ref Range   Lipase 30 11 - 59 U/L  CBC with Differential  Result Value Ref Range   WBC 8.6 4.0 - 10.5 K/uL   RBC 5.29 4.22 - 5.81 MIL/uL   Hemoglobin 15.2 13.0 - 17.0 g/dL   HCT 21.3 08.6 - 57.8 %   MCV 80.7 78.0 - 100.0 fL   MCH 28.7 26.0 - 34.0 pg   MCHC 35.6 30.0 - 36.0 g/dL   RDW 46.9 62.9 - 52.8 %   Platelets 288 150 - 400 K/uL   Neutrophils Relative % 55 43 - 77 %   Neutro Abs 4.8 1.7 - 7.7 K/uL   Lymphocytes Relative 33 12 - 46 %   Lymphs Abs 2.8 0.7 - 4.0 K/uL   Monocytes Relative 8 3 - 12 %   Monocytes Absolute 0.7 0.1 - 1.0 K/uL   Eosinophils Relative 3 0 - 5 %   Eosinophils Absolute 0.2 0.0 - 0.7 K/uL   Basophils Relative 1 0 - 1 %   Basophils Absolute 0.1 0.0 - 0.1 K/uL   Imaging Review US Abdomen Complete  09/08/2014   CLINICAL DATA:  Acute onset of generalized  abdominal pain. Initial encounter.  EXAM: ULTRASOUND ABDOMEN COMPLETE  COMPARISON:  Abdominal ultrasound performed 04/26/2013  FINDINGS: Gallbladder: No gallstones or wall thickening visualized. Sludge is noted partially filling the gallbladder. No sonographic Murphy sign noted.  Common bile duct: Diameter: 0.6 cm, within normal limits in caliber.  Liver: No focal lesion identified. Diffusely increased parenchymal echogenicity likely reflects fatty infiltration.  IVC: No abnormality visualized.  Pancreas: Not visualized.  Spleen: Size and appearance within normal limits.  Right Kidney: Length: 12.2 cm. Echogenicity within normal limits. No mass or hydronephrosis visualized.  Left Kidney: Length: 12.6 cm. Echogenicity within normal limits. No mass or hydronephrosis visualized.  Abdominal aorta: Not visualized.  Other findings: None.  IMPRESSION: 1. Diffuse fatty infiltration within the liver. 2. Sludge noted partially filling the gallbladder. Gallbladder otherwise unremarkable in appearance.   Electronically Signed   By: Roanna Raider M.D.   On: 09/08/2014 06:29    MDM   Final diagnoses:  Abdominal pain, unspecified abdominal location  Biliary sludge determined by ultrasound    Abdominal pain of uncertain cause. Pattern is more suspicious for GERD and biliary tract disease. He will be sent for abdominal ultrasound. I spoken with him at length about his symptoms and I do feel that he would benefit from consultation with gastroenterology. Old records are reviewed confirming evaluation for hypertrophic cardiomyopathy with implantation of defibrillator.  Laboratory workup is unremarkable. Ultrasound shows sludge in the gallbladder. At this point, diagnosis is uncertain. She is referred to [facility] surgery for evaluation for possible cholecystectomy. Also, referred to gastroenterology for evaluation for possible GERD or peptic ulcer disease.   Dione Booze, MD 09/08/14 [phone]

## 2014-09-08 NOTE — ED Notes (Signed)
Patient presents with c/o right upper quadrant pain.  States he has dx himself with gallbladder trouble.  Ate a salad about 730pm and tonight he said the pain was worse and it felt like something was squeezing into his back

## 2014-09-08 NOTE — Discharge Instructions (Signed)
Your ultrasound did show some sludge in the gallbladder. I am not sure if this is causing your symptoms are not. You should see a surgeon to discuss whether your gallbladder should be removed. I believe you should also see a gastroenterologist to investigate your pain. I suspect that most of your problems are actually related to either ulcers or gastroesophageal reflux disease.  Cholelithiasis Cholelithiasis (also called gallstones) is a form of gallbladder disease in which gallstones form in your gallbladder. The gallbladder is an organ that stores bile made in the liver, which helps digest fats. Gallstones begin as small crystals and slowly grow into stones. Gallstone pain occurs when the gallbladder spasms and a gallstone is blocking the duct. Pain can also occur when a stone passes out of the duct.  RISK FACTORS  Being male.   Having multiple pregnancies. Health care providers sometimes advise removing diseased gallbladders before future pregnancies.   Being obese.  Eating a diet heavy in fried foods and fat.   Being older than 60 years and increasing age.   Prolonged use of medicines containing male hormones.   Having diabetes mellitus.   Rapidly losing weight.   Having a family history of gallstones (heredity).  SYMPTOMS  Nausea.   Vomiting.  Abdominal pain.   Yellowing of the skin (jaundice).   Sudden pain. It may persist from several minutes to several hours.  Fever.   Tenderness to the touch. In some cases, when gallstones do not move into the bile duct, people have no pain or symptoms. These are called "silent" gallstones.  TREATMENT Silent gallstones do not need treatment. In severe cases, emergency surgery may be required. Options for treatment include:  Surgery to remove the gallbladder. This is the most common treatment.  Medicines. These do not always work and may take 6-12 months or more to work.  Shock wave treatment (extracorporeal  biliary lithotripsy). In this treatment an ultrasound machine sends shock waves to the gallbladder to break gallstones into smaller pieces that can pass into the intestines or be dissolved by medicine. HOME CARE INSTRUCTIONS   Only take over-the-counter or prescription medicines for pain, discomfort, or fever as directed by your health care provider.   Follow a low-fat diet until seen again by your health care provider. Fat causes the gallbladder to contract, which can result in pain.   Follow up with your health care provider as directed. Attacks are almost always recurrent and surgery is usually required for permanent treatment.  SEEK IMMEDIATE MEDICAL CARE IF:   Your pain increases and is not controlled by medicines.   You have a fever or persistent symptoms for more than 2-3 days.   You have a fever and your symptoms suddenly get worse.   You have persistent nausea and vomiting.  MAKE SURE YOU:   Understand these instructions.  Will watch your condition.  Will get help right away if you are not doing well or get worse. Document Released: 04/25/2005 Document Revised: 12/30/2012 Document Reviewed: 10/21/2012 [facility] Patient Information 2015 [place]. This information is not intended to replace advice given to you by your health care provider. Make sure you discuss any questions you have with your health care provider.  Peptic Ulcer A peptic ulcer is a sore in the lining of your esophagus (esophageal ulcer), stomach (gastric ulcer), or in the first part of your small intestine (duodenal ulcer). The ulcer causes erosion into the deeper tissue. CAUSES  Normally, the lining of the stomach and the small  intestine protects itself from the acid that digests food. The protective lining can be damaged by:  An infection caused by a bacterium called Helicobacter pylori (H. pylori).  Regular use of nonsteroidal anti-inflammatory drugs (NSAIDs), such as ibuprofen or  aspirin.  Smoking tobacco. Other risk factors include being older than 50, drinking alcohol excessively, and having a family history of ulcer disease.  SYMPTOMS   Burning pain or gnawing in the area between the chest and the belly button.  Heartburn.  Nausea and vomiting.  Bloating. The pain can be worse on an empty stomach and at night. If the ulcer results in bleeding, it can cause:  Black, tarry stools.  Vomiting of bright red blood.  Vomiting of coffee-ground-looking materials. DIAGNOSIS  A diagnosis is usually made based upon your history and an exam. Other tests and procedures may be performed to find the cause of the ulcer. Finding a cause will help determine the best treatment. Tests and procedures may include:  Blood tests, stool tests, or breath tests to check for the bacterium H. pylori.  An upper gastrointestinal (GI) series of the esophagus, stomach, and small intestine.  An endoscopy to examine the esophagus, stomach, and small intestine.  A biopsy. TREATMENT  Treatment may include:  Eliminating the cause of the ulcer, such as smoking, NSAIDs, or alcohol.  Medicines to reduce the amount of acid in your digestive tract.  Antibiotic medicines if the ulcer is caused by the H. pylori bacterium.  An upper endoscopy to treat a bleeding ulcer.  Surgery if the bleeding is severe or if the ulcer created a hole somewhere in the digestive system. HOME CARE INSTRUCTIONS   Avoid tobacco, alcohol, and caffeine. Smoking can increase the acid in the stomach, and continued smoking will impair the healing of ulcers.  Avoid foods and drinks that seem to cause discomfort or aggravate your ulcer.  Only take medicines as directed by your caregiver. Do not substitute over-the-counter medicines for prescription medicines without talking to your caregiver.  Keep any follow-up appointments and tests as directed. SEEK MEDICAL CARE IF:   Your do not improve within 7 days of  starting treatment.  You have ongoing indigestion or heartburn. SEEK IMMEDIATE MEDICAL CARE IF:   You have sudden, sharp, or persistent abdominal pain.  You have bloody or dark black, tarry stools.  You vomit blood or vomit that looks like coffee grounds.  You become light-headed, weak, or feel faint.  You become sweaty or clammy. MAKE SURE YOU:   Understand these instructions.  Will watch your condition.  Will get help right away if you are not doing well or get worse. Document Released: 04/26/2000 Document Revised: 09/13/2013 Document Reviewed: 11/27/2011 [facility] Patient Information 2015 [place], [place]. This information is not intended to replace advice given to you by your health care provider. Make sure you discuss any questions you have with your health care provider.  Gastroesophageal Reflux Disease, Adult Gastroesophageal reflux disease (GERD) happens when acid from your stomach flows up into the esophagus. When acid comes in contact with the esophagus, the acid causes soreness (inflammation) in the esophagus. Over time, GERD may create small holes (ulcers) in the lining of the esophagus. CAUSES   Increased body weight. This puts pressure on the stomach, making acid rise from the stomach into the esophagus.  Smoking. This increases acid production in the stomach.  Drinking alcohol. This causes decreased pressure in the lower esophageal sphincter (valve or ring of muscle between the esophagus and stomach),  allowing acid from the stomach into the esophagus.  Late evening meals and a full stomach. This increases pressure and acid production in the stomach.  A malformed lower esophageal sphincter. Sometimes, no cause is found. SYMPTOMS   Burning pain in the lower part of the mid-chest behind the breastbone and in the mid-stomach area. This may occur twice a week or more often.  Trouble swallowing.  Sore throat.  Dry cough.  Asthma-like symptoms including chest  tightness, shortness of breath, or wheezing. DIAGNOSIS  Your caregiver may be able to diagnose GERD based on your symptoms. In some cases, X-rays and other tests may be done to check for complications or to check the condition of your stomach and esophagus. TREATMENT  Your caregiver may recommend over-the-counter or prescription medicines to help decrease acid production. Ask your caregiver before starting or adding any new medicines.  HOME CARE INSTRUCTIONS   Change the factors that you can control. Ask your caregiver for guidance concerning weight loss, quitting smoking, and alcohol consumption.  Avoid foods and drinks that make your symptoms worse, such as:  Caffeine or alcoholic drinks.  Chocolate.  Peppermint or mint flavorings.  Garlic and onions.  Spicy foods.  Citrus fruits, such as oranges, lemons, or limes.  Tomato-based foods such as sauce, chili, salsa, and pizza.  Fried and fatty foods.  Avoid lying down for the 3 hours prior to your bedtime or prior to taking a nap.  Eat small, frequent meals instead of large meals.  Wear loose-fitting clothing. Do not wear anything tight around your waist that causes pressure on your stomach.  Raise the head of your bed 6 to 8 inches with wood blocks to help you sleep. Extra pillows will not help.  Only take over-the-counter or prescription medicines for pain, discomfort, or fever as directed by your caregiver.  Do not take aspirin, ibuprofen, or other nonsteroidal anti-inflammatory drugs (NSAIDs). SEEK IMMEDIATE MEDICAL CARE IF:   You have pain in your arms, neck, jaw, teeth, or back.  Your pain increases or changes in intensity or duration.  You develop nausea, vomiting, or sweating (diaphoresis).  You develop shortness of breath, or you faint.  Your vomit is green, yellow, black, or looks like coffee grounds or blood.  Your stool is red, bloody, or black. These symptoms could be signs of other problems, such as  heart disease, gastric bleeding, or esophageal bleeding. MAKE SURE YOU:   Understand these instructions.  Will watch your condition.  Will get help right away if you are not doing well or get worse. Document Released: 02/06/2005 Document Revised: 07/22/2011 Document Reviewed: 11/16/2010 [facility] Patient Information 2015 [place], [place]. This information is not intended to replace advice given to you by your health care provider. Make sure you discuss any questions you have with your health care provider.

## 2014-09-26 DIAGNOSIS — K828 Other specified diseases of gallbladder: Secondary | ICD-10-CM | POA: Insufficient documentation

## 2014-09-26 DIAGNOSIS — K8050 Calculus of bile duct without cholangitis or cholecystitis without obstruction: Secondary | ICD-10-CM | POA: Insufficient documentation

## 2014-11-09 ENCOUNTER — Telehealth: Payer: Self-pay | Admitting: Cardiology

## 2014-11-09 ENCOUNTER — Encounter: Payer: BLUE CROSS/BLUE SHIELD | Admitting: *Deleted

## 2014-11-09 NOTE — Telephone Encounter (Signed)
Spoke with pt and reminded pt of remote transmission that is due today. Pt verbalized understanding.   

## 2014-11-10 ENCOUNTER — Encounter: Payer: Self-pay | Admitting: Cardiology

## 2015-03-02 ENCOUNTER — Ambulatory Visit (INDEPENDENT_AMBULATORY_CARE_PROVIDER_SITE_OTHER): Payer: BLUE CROSS/BLUE SHIELD | Admitting: *Deleted

## 2015-03-02 DIAGNOSIS — I422 Other hypertrophic cardiomyopathy: Secondary | ICD-10-CM

## 2015-03-06 NOTE — Progress Notes (Signed)
Remote ICD transmission.   

## 2015-03-08 ENCOUNTER — Encounter: Payer: Self-pay | Admitting: Cardiology

## 2015-03-08 LAB — CUP PACEART REMOTE DEVICE CHECK
Battery Remaining Percentage: 85 %
Battery Voltage: 3.02 V
Brady Statistic AP VP Percent: 1 %
Brady Statistic AP VS Percent: 1 %
Brady Statistic AS VP Percent: 1 %
Brady Statistic AS VS Percent: 99 %
Brady Statistic RV Percent Paced: 1 %
Date Time Interrogation Session: 20161020221403
HighPow Impedance: 77 Ohm
HighPow Impedance: 77 Ohm
Implantable Lead Implant Date: 20150625
Implantable Lead Implant Date: 20150625
Implantable Lead Location: 753860
Lead Channel Impedance Value: 540 Ohm
Lead Channel Impedance Value: 580 Ohm
Lead Channel Sensing Intrinsic Amplitude: 12 mV
Lead Channel Sensing Intrinsic Amplitude: 4.7 mV
Lead Channel Setting Pacing Amplitude: 2 V
Lead Channel Setting Pacing Amplitude: 2.5 V
Lead Channel Setting Pacing Pulse Width: 0.5 ms
Lead Channel Setting Sensing Sensitivity: 0.5 mV
MDC IDC LEAD LOCATION: 753859
MDC IDC MSMT BATTERY REMAINING LONGEVITY: 87 mo
MDC IDC STAT BRADY RA PERCENT PACED: 1 %
Pulse Gen Serial Number: 7199672

## 2015-04-01 ENCOUNTER — Encounter (HOSPITAL_COMMUNITY): Payer: Self-pay

## 2015-04-01 ENCOUNTER — Emergency Department (HOSPITAL_COMMUNITY)
Admission: EM | Admit: 2015-04-01 | Discharge: 2015-04-01 | Disposition: A | Discharge status: Home / Self Care | Payer: BLUE CROSS/BLUE SHIELD | Attending: Emergency Medicine | Admitting: Emergency Medicine

## 2015-04-01 DIAGNOSIS — F329 Major depressive disorder, single episode, unspecified: Secondary | ICD-10-CM | POA: Diagnosis not present

## 2015-04-01 DIAGNOSIS — Z7951 Long term (current) use of inhaled steroids: Secondary | ICD-10-CM | POA: Insufficient documentation

## 2015-04-01 DIAGNOSIS — Z8619 Personal history of other infectious and parasitic diseases: Secondary | ICD-10-CM | POA: Insufficient documentation

## 2015-04-01 DIAGNOSIS — Z8709 Personal history of other diseases of the respiratory system: Secondary | ICD-10-CM | POA: Diagnosis not present

## 2015-04-01 DIAGNOSIS — K219 Gastro-esophageal reflux disease without esophagitis: Secondary | ICD-10-CM | POA: Diagnosis not present

## 2015-04-01 DIAGNOSIS — Z95810 Presence of automatic (implantable) cardiac defibrillator: Secondary | ICD-10-CM | POA: Insufficient documentation

## 2015-04-01 DIAGNOSIS — F419 Anxiety disorder, unspecified: Secondary | ICD-10-CM | POA: Diagnosis not present

## 2015-04-01 DIAGNOSIS — K529 Noninfective gastroenteritis and colitis, unspecified: Secondary | ICD-10-CM | POA: Diagnosis not present

## 2015-04-01 DIAGNOSIS — Z8679 Personal history of other diseases of the circulatory system: Secondary | ICD-10-CM | POA: Insufficient documentation

## 2015-04-01 DIAGNOSIS — Z79899 Other long term (current) drug therapy: Secondary | ICD-10-CM | POA: Diagnosis not present

## 2015-04-01 DIAGNOSIS — R1110 Vomiting, unspecified: Secondary | ICD-10-CM | POA: Diagnosis present

## 2015-04-01 LAB — CBC WITH DIFFERENTIAL/PLATELET
Basophils Absolute: 0 10*3/uL (ref 0.0–0.1)
Basophils Relative: 0 %
EOS ABS: 0.1 10*3/uL (ref 0.0–0.7)
EOS PCT: 1 %
HEMATOCRIT: 46.5 % (ref 39.0–52.0)
HEMOGLOBIN: 16 g/dL (ref 13.0–17.0)
LYMPHS ABS: 0.7 10*3/uL (ref 0.7–4.0)
Lymphocytes Relative: 6 %
MCH: 28.7 pg (ref 26.0–34.0)
MCHC: 34.4 g/dL (ref 30.0–36.0)
MCV: 83.5 fL (ref 78.0–100.0)
MONOS PCT: 6 %
Monocytes Absolute: 0.7 10*3/uL (ref 0.1–1.0)
Neutro Abs: 9 10*3/uL — ABNORMAL HIGH (ref 1.7–7.7)
Neutrophils Relative %: 87 %
Platelets: 226 10*3/uL (ref 150–400)
RBC: 5.57 MIL/uL (ref 4.22–5.81)
RDW: 13 % (ref 11.5–15.5)
WBC: 10.4 10*3/uL (ref 4.0–10.5)

## 2015-04-01 LAB — LIPASE, BLOOD: LIPASE: 51 U/L (ref 11–51)

## 2015-04-01 LAB — COMPREHENSIVE METABOLIC PANEL
ALK PHOS: 90 U/L (ref 38–126)
ALT: 113 U/L — AB (ref 17–63)
AST: 70 U/L — ABNORMAL HIGH (ref 15–41)
Albumin: 4.4 g/dL (ref 3.5–5.0)
Anion gap: 12 (ref 5–15)
BILIRUBIN TOTAL: 0.6 mg/dL (ref 0.3–1.2)
BUN: 21 mg/dL — AB (ref 6–20)
CO2: 25 mmol/L (ref 22–32)
CREATININE: 1.09 mg/dL (ref 0.61–1.24)
Calcium: 9.6 mg/dL (ref 8.9–10.3)
Chloride: 105 mmol/L (ref 101–111)
GFR calc Af Amer: >60 mL/min (ref >60)
Glucose, Bld: 144 mg/dL — ABNORMAL HIGH (ref 65–99)
Potassium: 4.1 mmol/L (ref 3.5–5.1)
Sodium: 142 mmol/L (ref 135–145)
TOTAL PROTEIN: 7.9 g/dL (ref 6.5–8.1)

## 2015-04-01 MED ORDER — SODIUM CHLORIDE 0.9 % IV BOLUS (SEPSIS)
1000.0000 mL | Freq: Once | INTRAVENOUS | Status: AC
  Administered 2015-04-01: 1000 mL via INTRAVENOUS

## 2015-04-01 MED ORDER — ONDANSETRON 4 MG PO TBDP: 4.0000 mg | ORAL_TABLET | Freq: Three times a day (TID) | ORAL | Status: DC | PRN

## 2015-04-01 MED ORDER — ONDANSETRON HCL 4 MG/2ML IJ SOLN
4.0000 mg | Freq: Once | INTRAMUSCULAR | Status: AC
  Administered 2015-04-01: 4 mg via INTRAVENOUS
  Filled 2015-04-01: qty 2

## 2015-04-01 MED ORDER — SODIUM CHLORIDE 0.9 % IV SOLN
INTRAVENOUS | Status: DC
  Administered 2015-04-01: 100 mL/h via INTRAVENOUS

## 2015-04-01 MED ORDER — PROMETHAZINE HCL 25 MG PO TABS: 25.0000 mg | ORAL_TABLET | Freq: Four times a day (QID) | ORAL | Status: DC | PRN

## 2015-04-01 MED ORDER — PROMETHAZINE HCL 25 MG/ML IJ SOLN
12.5000 mg | Freq: Once | INTRAMUSCULAR | Status: AC
  Administered 2015-04-01: 12.5 mg via INTRAVENOUS
  Filled 2015-04-01: qty 1

## 2015-04-01 NOTE — Discharge Instructions (Signed)
Take the Zofran and Phenergan as needed for the nausea and vomiting can take one or the other or can take both together. Clear liquids today advance to bland diet tomorrow. Return for any new or worse symptoms. Follow-up with your regular doctor as needed.

## 2015-04-01 NOTE — ED Notes (Signed)
Pt ambulated to bathroom, 1 episode of diarrhea.

## 2015-04-01 NOTE — ED Notes (Signed)
Pt states after eating dinner he begin to have nausea vomiting and diarrhea; Pt states he has heart condition that requires him to stay hydrated; pt states he feel  Dehydrated; pt has hx of syncopal episodes; pt A&O x 4 on arrival;

## 2015-04-01 NOTE — ED Notes (Signed)
Ok for pt to have PO fluids per EDP. Pt given diet coke.

## 2015-04-01 NOTE — ED Provider Notes (Signed)
CSN: [id number]     Arrival date & time 04/01/15  4098 History   First MD Initiated Contact with Patient 04/01/15 [phone]     Chief Complaint  Patient presents with  . Emesis  . Diarrhea     (Consider location/radiation/quality/duration/timing/severity/associated sxs/prior Treatment) Patient is a 39 y.o. male presenting with vomiting and diarrhea. The history is provided by the patient and the spouse.  Emesis Associated symptoms: diarrhea   Associated symptoms: no abdominal pain, no headaches and no myalgias   Diarrhea Associated symptoms: vomiting   Associated symptoms: no abdominal pain, no fever, no headaches and no myalgias    patient with acute onset of nausea vomiting and diarrhea last evening around dinnertime. His had multiple episodes since then. Patient concerned about getting dehydrated. No blood in the vomit or the bowel movements. Patient has a history of cardiac problems with a pacemaker defibrillator for hypertrophic cardiomyopathy. No significant abdominal pain with it. No fevers. No chest pain no shortness of breath.  Past Medical History  Diagnosis Date  . Anxiety   . Palpitations   . Syncope   . Back pain     without radiation  . Genital HSV   . Tachycardia, unspecified   . Syncope and collapse   . Depressive disorder, not elsewhere classified   . Other specified circulatory system disorders   . Allergic rhinitis, cause unspecified   . Viral syndrome   . Abnormal heart rhythm   . Heartburn     Long Hx  . Substernal chest pain 04/22/13    Pt reports having vague symptoms of substernal CP, different from heartburn in that it is dull & radiatesto the back. Occurring at least 3 episodes over past several weeks. Pain free in between episodes. (04/22/13 Dr. Carman Ching GI)   . Esophageal reflux     Chronic but his current pain appears to be different than his radiating to the back. Possible biliary disease. (04/22/13 Dr. Carman Ching, GI)  . Wide QRS ventricular  tachycardia (HCC)     Noted on event monitor (04/20/12-05/20/12) at night  . Bradycardia     Noted on event monitor (04/20/12-05/20/12) Just before bradycardia was recorded, HR was approx. 130bpm  . Near syncope     While wearing event monitor (04/20/12-05/20/12) 46bpm. Also had chest tightness at this time. Possible vagal ([place] 05/22/12)  . PVC (premature ventricular contraction)     Noted on event monitor (04/20/12-05/20/12)    Past Surgical History  Procedure Laterality Date  . Shoulder surgery Right     Dislocated  . Cardiac defibrillator placement  11/04/13    SJM Fortify Assura DR implanted by Dr [name] for hypertrophic CM and syncope  . Implantable cardioverter defibrillator implant N/A 11/04/2013    Procedure: IMPLANTABLE CARDIOVERTER DEFIBRILLATOR IMPLANT;  Surgeon: Gardiner Rhyme, MD;  Location[facility] C[facility] LAB;  Service: Cardiovascular;  Laterality: N/A;   Family History  Problem Relation Age of Onset  . CAD Paternal Uncle     paternal uncle died suddenly, per pt autopsy confirmed MI  . Breast cancer Mother 17  . Hiatal hernia Father   . Cardiomyopathy Father     Hypertrophic cardiomyopathy HOCM  . Anxiety disorder Sister   . Heart disease Paternal Uncle     Has a VAD and IDC  . Cardiomyopathy Paternal Uncle     Hypertrophic cardiomyopathy HOCM  . Diabetes Paternal Grandmother     Hypertrophic cardiomyopathy  . Diabetes Paternal Grandfather   . Cardiomyopathy Paternal  Grandfather     Hypertrophic cardiomyopathy HOCM  . Heart murmur Sister   . GER disease Father   . Supraventricular tachycardia Father   . Heart attack Paternal Uncle 51  . Diabetes Paternal Uncle   . Cardiomyopathy Cousin     hypertrophic cardiomyopathy  . Heart disease Cousin     mitral valve repair and heart ablation surgery  . Macular degeneration Maternal Grandmother   . Scoliosis Mother   . Osteoporosis Mother   . Marfan syndrome Maternal Aunt   . Scoliosis Maternal Aunt   . Mitral valve  prolapse Maternal Aunt   . Mitral valve prolapse Maternal Uncle   . Other Maternal Uncle     connective tissue issues  . Mitral valve prolapse Cousin   . Other Cousin     thoracic output surgery  . Other Cousin     thoracic output surgery   Social History  Substance Use Topics  . Smoking status: Never Smoker   . Smokeless tobacco: Never Used  . Alcohol Use: Yes     Comment: 1-2 mixed drinks per day    Review of Systems  Constitutional: Positive for fatigue. Negative for fever.  HENT: Negative for congestion.   Eyes: Negative for visual disturbance.  Respiratory: Negative for shortness of breath.   Cardiovascular: Negative for chest pain.  Gastrointestinal: Positive for nausea, vomiting and diarrhea. Negative for abdominal pain and blood in stool.  Genitourinary: Negative for hematuria.  Musculoskeletal: Negative for myalgias.  Neurological: Negative for headaches.  Hematological: Does not bruise/bleed easily.  Psychiatric/Behavioral: Negative for confusion.      Allergies  Review of patient's allergies indicates no known allergies.  Home Medications   Prior to Admission medications   Medication Sig Start Date End Date Taking? Authorizing Provider  ALPRAZolam Prudy Feeler(XANAX) 0.5 MG tablet Take 0.5 mg by mouth daily as needed for anxiety.    Yes Historical Provider, MD  FLUoxetine (PROZAC) 20 MG capsule Take 20 mg by mouth daily.   Yes Historical Provider, MD  metoprolol succinate (TOPROL-XL) 25 MG 24 hr tablet Take 25 mg by mouth daily.   Yes Historical Provider, MD  Omeprazole Magnesium (PRILOSEC OTC PO) Take 1 tablet by mouth daily.    Yes Historical Provider, MD  valACYclovir (VALTREX) 500 MG tablet Take 500 mg by mouth daily.  05/04/12  Yes Historical Provider, MD  ondansetron (ZOFRAN ODT) 4 MG disintegrating tablet Take 1 tablet (4 mg total) by mouth every 8 (eight) hours as needed for nausea or vomiting. 04/01/15   Vanetta MuldersScott Donelle Hise, MD  promethazine (PHENERGAN) 25 MG tablet  Take 1 tablet (25 mg total) by mouth every 6 (six) hours as needed for nausea or vomiting. 04/01/15   Vanetta MuldersScott Mardell Cragg, MD   BP 113/81 mmHg  Pulse 98  Resp 24  Ht 6\' 2"  (1.88 m)  Wt 215 lb (97.523 kg)  BMI 27.59 kg/m2  SpO2 93% Physical Exam  Constitutional: He is oriented to person, place, and time. He appears well-developed and well-nourished. No distress.  HENT:  Head: Normocephalic and atraumatic.  Mouth/Throat: Oropharynx is clear and moist.  Eyes: Conjunctivae and EOM are normal. Pupils are equal, round, and reactive to light.  Neck: Normal range of motion.  Cardiovascular: Normal rate, regular rhythm and normal heart sounds.   Pulmonary/Chest: Effort normal and breath sounds normal. No respiratory distress.  Abdominal: Soft. Bowel sounds are normal. There is no tenderness.  Musculoskeletal: Normal range of motion. He exhibits no edema.  Neurological: He is  alert and oriented to person, place, and time. No cranial nerve deficit. Coordination normal.  Skin: Skin is warm. No rash noted.  Nursing note and vitals reviewed.   ED Course  Procedures (including critical care time) Labs Review Labs Reviewed  CBC WITH DIFFERENTIAL/PLATELET - Abnormal; Notable for the following:    Neutro Abs 9.0 (*)    All other components within normal limits  COMPREHENSIVE METABOLIC PANEL - Abnormal; Notable for the following:    Glucose, Bld 144 (*)    BUN 21 (*)    AST 70 (*)    ALT 113 (*)    All other components within normal limits  LIPASE, BLOOD   Results for orders placed or performed during the hospital encounter of 04/01/15  CBC with Differential  Result Value Ref Range   WBC 10.4 4.0 - 10.5 K/uL   RBC 5.57 4.22 - 5.81 MIL/uL   Hemoglobin 16.0 13.0 - 17.0 g/dL   HCT 16.1 09.6 - 04.5 %   MCV 83.5 78.0 - 100.0 fL   MCH 28.7 26.0 - 34.0 pg   MCHC 34.4 30.0 - 36.0 g/dL   RDW 40.9 81.1 - 91.4 %   Platelets 226 150 - 400 K/uL   Neutrophils Relative % 87 %   Neutro Abs 9.0 (H) 1.7  - 7.7 K/uL   Lymphocytes Relative 6 %   Lymphs Abs 0.7 0.7 - 4.0 K/uL   Monocytes Relative 6 %   Monocytes Absolute 0.7 0.1 - 1.0 K/uL   Eosinophils Relative 1 %   Eosinophils Absolute 0.1 0.0 - 0.7 K/uL   Basophils Relative 0 %   Basophils Absolute 0.0 0.0 - 0.1 K/uL  Comprehensive metabolic panel  Result Value Ref Range   Sodium 142 135 - 145 mmol/L   Potassium 4.1 3.5 - 5.1 mmol/L   Chloride 105 101 - 111 mmol/L   CO2 25 22 - 32 mmol/L   Glucose, Bld 144 (H) 65 - 99 mg/dL   BUN 21 (H) 6 - 20 mg/dL   Creatinine, Ser 7.82 0.61 - 1.24 mg/dL   Calcium 9.6 8.9 - 95.6 mg/dL   Total Protein 7.9 6.5 - 8.1 g/dL   Albumin 4.4 3.5 - 5.0 g/dL   AST 70 (H) 15 - 41 U/L   ALT 113 (H) 17 - 63 U/L   Alkaline Phosphatase 90 38 - 126 U/L   Total Bilirubin 0.6 0.3 - 1.2 mg/dL   GFR calc non Af Amer >60 >60 mL/min   GFR calc Af Amer >60 >60 mL/min   Anion gap 12 5 - 15  Lipase, blood  Result Value Ref Range   Lipase 51 11 - 51 U/L     Imaging Review No results found. I have personally reviewed and evaluated these images and lab results as part of my medical decision-making.   EKG Interpretation   Date/Time:  Saturday April 01 2015 08:02:10 EST Ventricular Rate:  85 PR Interval:  203 QRS Duration: 93 QT Interval:  378 QTC Calculation: 449 R Axis:   163 Text Interpretation:  Sinus rhythm Borderline prolonged PR interval  Consider left atrial enlargement Right axis deviation RSR' in V1 or V2,  probably normal variant Borderline T abnormalities, inferior leads  Confirmed by Zackerie Sara  MD, Dorice Stiggers ([id number]) on 04/01/2015 11:09:24 AM      MDM   Final diagnoses:  Gastroenteritis    Patient symptoms consistent with a gastroenteritis. Turbid nausea vomiting and diarrhea several episodes yesterday around dinnertime. Patient does  have a defibrillator pacemaker due to hypertrophic cardiomyopathy. Patient without any shortness of breath or any chest pain problems. Defibrillator has not  fired. Patient is concerned about being dehydrated since the episodes were multiple. No blood in the vomit or the bowel movements. No significant abdominal pain.  Workup here showed an elevation in the BUN consistent with dehydration. Patient received 2 L of normal saline boluses here with improvement. Patient also received Zofran and Phenergan with improvement in the nausea. No further vomiting while here. Patient will be discharged home with Phenergan and Zofran to take. Will follow-up with primary care doctor and cardiologist as needed.    Vanetta Mulders, MD 04/01/15 1353

## 2015-04-01 NOTE — ED Notes (Signed)
Pt has an AICD -- has never fired. Was checked last month.

## 2015-04-12 ENCOUNTER — Encounter: Payer: Self-pay | Admitting: Internal Medicine

## 2015-04-12 ENCOUNTER — Ambulatory Visit (INDEPENDENT_AMBULATORY_CARE_PROVIDER_SITE_OTHER): Payer: BLUE CROSS/BLUE SHIELD | Admitting: Internal Medicine

## 2015-04-12 VITALS — BP 120/80 | HR 71 | Ht 74.0 in | Wt 217.6 lb

## 2015-04-12 DIAGNOSIS — Z95810 Presence of automatic (implantable) cardiac defibrillator: Secondary | ICD-10-CM

## 2015-04-12 DIAGNOSIS — I472 Ventricular tachycardia, unspecified: Secondary | ICD-10-CM

## 2015-04-12 DIAGNOSIS — I422 Other hypertrophic cardiomyopathy: Secondary | ICD-10-CM

## 2015-04-12 DIAGNOSIS — R55 Syncope and collapse: Secondary | ICD-10-CM

## 2015-04-12 LAB — BASIC METABOLIC PANEL
BUN: 18 mg/dL (ref 7–25)
CO2: 28 mmol/L (ref 20–31)
Calcium: 9.1 mg/dL (ref 8.6–10.3)
Chloride: 100 mmol/L (ref 98–110)
Creat: 0.88 mg/dL (ref 0.60–1.35)
Glucose, Bld: 80 mg/dL (ref 65–99)
Potassium: 4.3 mmol/L (ref 3.5–5.3)
Sodium: 136 mmol/L (ref 135–146)

## 2015-04-12 LAB — CUP PACEART INCLINIC DEVICE CHECK
Battery Remaining Longevity: 92.4
HighPow Impedance: 81 Ohm
Implantable Lead Implant Date: 20150625
Implantable Lead Implant Date: 20150625
Lead Channel Impedance Value: 575 Ohm
Lead Channel Pacing Threshold Amplitude: 0.75 V
Lead Channel Pacing Threshold Pulse Width: 0.5 ms
Lead Channel Pacing Threshold Pulse Width: 0.5 ms
Lead Channel Sensing Intrinsic Amplitude: 12 mV
Lead Channel Setting Pacing Amplitude: 2 V
Lead Channel Setting Pacing Amplitude: 2.5 V
Lead Channel Setting Pacing Pulse Width: 0.5 ms
Lead Channel Setting Sensing Sensitivity: 0.5 mV
MDC IDC LEAD LOCATION: 753859
MDC IDC LEAD LOCATION: 753860
MDC IDC MSMT LEADCHNL RA IMPEDANCE VALUE: 475 Ohm
MDC IDC MSMT LEADCHNL RA PACING THRESHOLD AMPLITUDE: 0.75 V
MDC IDC MSMT LEADCHNL RA SENSING INTR AMPL: 5 mV
MDC IDC PG SERIAL: 7199672
MDC IDC SESS DTM: 20161130141215
MDC IDC STAT BRADY RA PERCENT PACED: 0.23 %
MDC IDC STAT BRADY RV PERCENT PACED: 0 %

## 2015-04-12 LAB — MAGNESIUM: Magnesium: 1.9 mg/dL (ref 1.5–2.5)

## 2015-04-12 NOTE — Patient Instructions (Signed)
Medication Instructions:  Your physician recommends that you continue on your current medications as directed. Please refer to the Current Medication list given to you today.   Labwork: Your physician recommends that you return for lab work today:BMP/MAG    Testing/Procedures: None ordered   Follow-Up: Your physician wants you to follow-up in: 12 months with Dr Johney FrameAllred Bonita QuinYou will receive a reminder letter in the mail two months in advance. If you don't receive a letter, please call our office to schedule the follow-up appointment.  Remote monitoring is used to monitor your  ICD from home. This monitoring reduces the number of office visits required to check your device to one time per year. It allows us to keep an eye on the functioning of your device to ensure it is working properly. You are scheduled for a device check from home on 07/12/15. You may send your transmission at any time that day. If you have a wireless device, the transmission will be sent automatically. After your physician reviews your transmission, you will receive a postcard with your next transmission date.    Any Other Special Instructions Will Be Listed Below (If Applicable).     If you need a refill on your cardiac medications before your next appointment, please call your pharmacy.

## 2015-04-13 NOTE — Progress Notes (Signed)
PCP: Hillis Range, MD Primary Cardiologist:  Dr Durwin Reges David Booker is a 39 y.o. male who presents today for routine electrophysiology followup.  Since his last visit, the patient reports doing very well.  He has not had syncope since his last visit with Dr [name].  He was seen at the syncope clinic at [facility] and now drinks 100 Oz of water per day.   Today, he denies symptoms of palpitations, chest pain, shortness of breath,  lower extremity edema, dizziness, or ICD shocks.  The patient is otherwise without complaint today.   Past Medical History  Diagnosis Date  . Anxiety   . Palpitations   . Syncope   . Back pain     without radiation  . Genital HSV   . Tachycardia, unspecified   . Syncope and collapse   . Depressive disorder, not elsewhere classified   . Other specified circulatory system disorders   . Allergic rhinitis, cause unspecified   . Viral syndrome   . Abnormal heart rhythm   . Heartburn     Long Hx  . Substernal chest pain 04/22/13    Pt reports having vague symptoms of substernal CP, different from heartburn in that it is dull & radiatesto the back. Occurring at least 3 episodes over past several weeks. Pain free in between episodes. (04/22/13 Dr. Carman Ching GI)   . Esophageal reflux     Chronic but his current pain appears to be different than his radiating to the back. Possible biliary disease. (04/22/13 Dr. Carman Ching, GI)  . Wide QRS ventricular tachycardia (HCC)     Noted on event monitor (04/20/12-05/20/12) at night  . Bradycardia     Noted on event monitor (04/20/12-05/20/12) Just before bradycardia was recorded, HR was approx. 130bpm  . Near syncope     While wearing event monitor (04/20/12-05/20/12) 46bpm. Also had chest tightness at this time. Possible vagal ([place] 05/22/12)  . PVC (premature ventricular contraction)     Noted on event monitor (04/20/12-05/20/12)    Past Surgical History  Procedure Laterality Date  . Shoulder surgery Right     Dislocated  . Cardiac defibrillator placement  11/04/13    SJM Fortify Assura DR implanted by Dr [name] for hypertrophic CM and syncope  . Implantable cardioverter defibrillator implant N/A 11/04/2013    Procedure: IMPLANTABLE CARDIOVERTER DEFIBRILLATOR IMPLANT;  Surgeon: Gardiner Rhyme, MD;  Location: [facility] CATH LAB;  Service: Cardiovascular;  Laterality: N/A;    ROS- all systems are reviewed and negative except as per HPI above  Current Outpatient Prescriptions  Medication Sig Dispense Refill  . ALPRAZolam (XANAX) 0.5 MG tablet Take 0.5 mg by mouth daily as needed for anxiety.     Marland Kitchen FLUoxetine (PROZAC) 20 MG capsule Take 20 mg by mouth daily.    . metoprolol succinate (TOPROL-XL) 25 MG 24 hr tablet Take 25 mg by mouth daily.    . Omeprazole Magnesium (PRILOSEC OTC PO) Take 1 tablet by mouth daily.     . valACYclovir (VALTREX) 500 MG tablet Take 500 mg by mouth daily.      No current facility-administered medications for this visit.    Physical Exam: Filed Vitals:   04/12/15 1056  BP: 120/80  Pulse: 71  Height:  (1.88 m)  Weight: 217 lb 9.6 oz (98.703 kg)    GEN- The patient is well appearing, alert and oriented x 3 today.   Head- normocephalic, atraumatic Eyes-  Sclera clear, conjunctiva pink Ears- hearing intact Oropharynx- clear  Lungs- Clear to ausculation bilaterally, normal work of breathing Chest- ICD pocket is well healed Heart- Regular rate and rhythm, no murmurs, rubs or gallops, PMI not laterally displaced GI- soft, NT, ND, + BS Extremities- no clubbing, cyanosis, or edema  ICD interrogation- reviewed in detail today,  See PACEART report  Assessment and Plan:  1.  Hypertrophic CM with syncope Normal ICD function See Pace Art report No changes today No arrhythmias detected.     Merlin Return to see me in 1 year

## 2015-07-06 DIAGNOSIS — Y9389 Activity, other specified: Secondary | ICD-10-CM | POA: Insufficient documentation

## 2015-07-12 ENCOUNTER — Ambulatory Visit (INDEPENDENT_AMBULATORY_CARE_PROVIDER_SITE_OTHER): Payer: BLUE CROSS/BLUE SHIELD | Admitting: *Deleted

## 2015-07-12 DIAGNOSIS — I422 Other hypertrophic cardiomyopathy: Secondary | ICD-10-CM | POA: Diagnosis not present

## 2015-07-12 NOTE — Progress Notes (Signed)
Remote ICD transmission.   

## 2015-07-22 LAB — CUP PACEART REMOTE DEVICE CHECK
Battery Remaining Percentage: 81 %
Battery Voltage: 2.99 V
Brady Statistic AP VP Percent: 1 %
Brady Statistic AP VS Percent: 1 %
Brady Statistic AS VS Percent: 99 %
Brady Statistic RV Percent Paced: 1 %
Date Time Interrogation Session: 20170301095650
HIGH POWER IMPEDANCE MEASURED VALUE: 83 Ohm
HighPow Impedance: 83 Ohm
Implantable Lead Implant Date: 20150625
Implantable Lead Location: 753859
Implantable Lead Location: 753860
Lead Channel Impedance Value: 460 Ohm
Lead Channel Impedance Value: 540 Ohm
Lead Channel Pacing Threshold Amplitude: 0.75 V
Lead Channel Pacing Threshold Pulse Width: 0.5 ms
Lead Channel Pacing Threshold Pulse Width: 0.5 ms
Lead Channel Sensing Intrinsic Amplitude: 12 mV
Lead Channel Sensing Intrinsic Amplitude: 5 mV
Lead Channel Setting Pacing Amplitude: 2 V
Lead Channel Setting Pacing Amplitude: 2.5 V
Lead Channel Setting Sensing Sensitivity: 0.5 mV
MDC IDC LEAD IMPLANT DT: 20150625
MDC IDC MSMT BATTERY REMAINING LONGEVITY: 82 mo
MDC IDC MSMT LEADCHNL RV PACING THRESHOLD AMPLITUDE: 0.75 V
MDC IDC SET LEADCHNL RV PACING PULSEWIDTH: 0.5 ms
MDC IDC STAT BRADY AS VP PERCENT: 1 %
MDC IDC STAT BRADY RA PERCENT PACED: 1 %
Pulse Gen Serial Number: 7199672

## 2015-07-22 NOTE — Progress Notes (Signed)
Normal remote reviewed.  Next Merlin 10/11/15 

## 2015-07-26 ENCOUNTER — Encounter: Payer: Self-pay | Admitting: Cardiology

## 2015-09-05 DIAGNOSIS — H5203 Hypermetropia, bilateral: Secondary | ICD-10-CM | POA: Diagnosis not present

## 2015-09-25 DIAGNOSIS — I422 Other hypertrophic cardiomyopathy: Secondary | ICD-10-CM | POA: Diagnosis not present

## 2015-10-11 ENCOUNTER — Ambulatory Visit (INDEPENDENT_AMBULATORY_CARE_PROVIDER_SITE_OTHER): Payer: BLUE CROSS/BLUE SHIELD | Admitting: *Deleted

## 2015-10-11 DIAGNOSIS — I422 Other hypertrophic cardiomyopathy: Secondary | ICD-10-CM

## 2015-10-11 NOTE — Progress Notes (Signed)
Remote ICD transmission.   

## 2015-10-26 LAB — CUP PACEART REMOTE DEVICE CHECK
Battery Remaining Longevity: 82 mo
Battery Remaining Percentage: 80 %
Battery Voltage: 2.99 V
Brady Statistic AP VS Percent: 1 %
Brady Statistic AS VS Percent: 99 %
Brady Statistic RV Percent Paced: 1 %
Date Time Interrogation Session: 20170531060033
HIGH POWER IMPEDANCE MEASURED VALUE: 88 Ohm
HighPow Impedance: 88 Ohm
Implantable Lead Implant Date: 20150625
Lead Channel Impedance Value: 590 Ohm
Lead Channel Pacing Threshold Amplitude: 0.75 V
Lead Channel Pacing Threshold Pulse Width: 0.5 ms
Lead Channel Pacing Threshold Pulse Width: 0.5 ms
Lead Channel Sensing Intrinsic Amplitude: 12 mV
Lead Channel Sensing Intrinsic Amplitude: 4.2 mV
Lead Channel Setting Pacing Amplitude: 2.5 V
Lead Channel Setting Pacing Pulse Width: 0.5 ms
MDC IDC LEAD IMPLANT DT: 20150625
MDC IDC LEAD LOCATION: 753859
MDC IDC LEAD LOCATION: 753860
MDC IDC MSMT LEADCHNL RA IMPEDANCE VALUE: 510 Ohm
MDC IDC MSMT LEADCHNL RV PACING THRESHOLD AMPLITUDE: 0.75 V
MDC IDC PG SERIAL: 7199672
MDC IDC SET LEADCHNL RA PACING AMPLITUDE: 2 V
MDC IDC SET LEADCHNL RV SENSING SENSITIVITY: 0.5 mV
MDC IDC STAT BRADY AP VP PERCENT: 1 %
MDC IDC STAT BRADY AS VP PERCENT: 1 %
MDC IDC STAT BRADY RA PERCENT PACED: 1 %

## 2015-11-01 ENCOUNTER — Encounter: Payer: Self-pay | Admitting: Cardiology

## 2015-12-11 ENCOUNTER — Telehealth: Payer: Self-pay | Admitting: Cardiology

## 2015-12-11 NOTE — Telephone Encounter (Signed)
LMOVM requesting that pt send manual transmission b/c home monitor has not updated in at least 8 days.   

## 2015-12-27 ENCOUNTER — Telehealth: Payer: Self-pay | Admitting: Cardiology

## 2015-12-27 NOTE — Telephone Encounter (Signed)
LMOVM requesting that pt send manual transmission b/c home monitor has not updated in at least 8 days.   

## 2016-01-10 ENCOUNTER — Encounter: Payer: BLUE CROSS/BLUE SHIELD | Admitting: *Deleted

## 2016-01-10 ENCOUNTER — Telehealth: Payer: Self-pay | Admitting: Cardiology

## 2016-01-10 NOTE — Telephone Encounter (Signed)
LMOVM reminding pt to send remote transmission.   

## 2016-01-12 ENCOUNTER — Encounter: Payer: Self-pay | Admitting: Cardiology

## 2016-01-18 ENCOUNTER — Telehealth: Payer: Self-pay | Admitting: Cardiology

## 2016-01-18 NOTE — Telephone Encounter (Signed)
Spoke w/ pt and requested that he send a manual transmission b/c his home monitor has not updated in at least 8 days. Pt stated that his wireless adapter isn't working. Instructed pt to call tech services to get assistance w/ his wireless adapter. Pt verbalized understanding.

## 2016-02-16 DIAGNOSIS — F419 Anxiety disorder, unspecified: Secondary | ICD-10-CM | POA: Diagnosis not present

## 2016-03-29 ENCOUNTER — Telehealth: Payer: Self-pay | Admitting: Cardiology

## 2016-03-29 NOTE — Telephone Encounter (Signed)
LMOVM requesting that pt send manual transmission b/c home monitor has not updated in at least 7 days.    

## 2016-04-11 ENCOUNTER — Telehealth: Payer: Self-pay | Admitting: Cardiology

## 2016-04-11 NOTE — Telephone Encounter (Signed)
LMOVM requesting that pt send manual transmission b/c home monitor has not updated in at least 7 days.    

## 2016-04-18 ENCOUNTER — Telehealth: Payer: Self-pay | Admitting: Cardiology

## 2016-04-18 NOTE — Telephone Encounter (Signed)
LMOVM requesting that pt send manual transmission b/c home monitor has not updated in at least 7 days.    

## 2016-04-25 ENCOUNTER — Telehealth: Payer: Self-pay | Admitting: Cardiology

## 2016-04-25 NOTE — Telephone Encounter (Signed)
LMOVM requesting that pt send manual transmission b/c home monitor has not updated in at least 7 days.    

## 2016-07-19 ENCOUNTER — Encounter: Payer: Self-pay | Admitting: Cardiology

## 2016-09-30 DIAGNOSIS — I422 Other hypertrophic cardiomyopathy: Secondary | ICD-10-CM | POA: Diagnosis not present

## 2017-03-13 DIAGNOSIS — F419 Anxiety disorder, unspecified: Secondary | ICD-10-CM | POA: Diagnosis not present

## 2017-03-13 DIAGNOSIS — K219 Gastro-esophageal reflux disease without esophagitis: Secondary | ICD-10-CM | POA: Diagnosis not present

## 2017-03-13 DIAGNOSIS — I422 Other hypertrophic cardiomyopathy: Secondary | ICD-10-CM | POA: Diagnosis not present

## 2017-03-13 DIAGNOSIS — B009 Herpesviral infection, unspecified: Secondary | ICD-10-CM | POA: Diagnosis not present

## 2017-03-24 DIAGNOSIS — H02831 Dermatochalasis of right upper eyelid: Secondary | ICD-10-CM | POA: Diagnosis not present

## 2017-03-24 DIAGNOSIS — H02834 Dermatochalasis of left upper eyelid: Secondary | ICD-10-CM | POA: Diagnosis not present

## 2017-03-24 DIAGNOSIS — H02832 Dermatochalasis of right lower eyelid: Secondary | ICD-10-CM | POA: Diagnosis not present

## 2017-03-24 DIAGNOSIS — H02835 Dermatochalasis of left lower eyelid: Secondary | ICD-10-CM | POA: Diagnosis not present

## 2017-04-08 DIAGNOSIS — M9905 Segmental and somatic dysfunction of pelvic region: Secondary | ICD-10-CM | POA: Diagnosis not present

## 2017-04-08 DIAGNOSIS — M9901 Segmental and somatic dysfunction of cervical region: Secondary | ICD-10-CM | POA: Diagnosis not present

## 2017-04-08 DIAGNOSIS — M9902 Segmental and somatic dysfunction of thoracic region: Secondary | ICD-10-CM | POA: Diagnosis not present

## 2017-04-08 DIAGNOSIS — M9903 Segmental and somatic dysfunction of lumbar region: Secondary | ICD-10-CM | POA: Diagnosis not present

## 2017-04-17 DIAGNOSIS — M9901 Segmental and somatic dysfunction of cervical region: Secondary | ICD-10-CM | POA: Diagnosis not present

## 2017-04-17 DIAGNOSIS — M9903 Segmental and somatic dysfunction of lumbar region: Secondary | ICD-10-CM | POA: Diagnosis not present

## 2017-04-17 DIAGNOSIS — M9905 Segmental and somatic dysfunction of pelvic region: Secondary | ICD-10-CM | POA: Diagnosis not present

## 2017-04-17 DIAGNOSIS — M9902 Segmental and somatic dysfunction of thoracic region: Secondary | ICD-10-CM | POA: Diagnosis not present

## 2017-04-24 DIAGNOSIS — M9905 Segmental and somatic dysfunction of pelvic region: Secondary | ICD-10-CM | POA: Diagnosis not present

## 2017-04-24 DIAGNOSIS — M9901 Segmental and somatic dysfunction of cervical region: Secondary | ICD-10-CM | POA: Diagnosis not present

## 2017-04-24 DIAGNOSIS — M9903 Segmental and somatic dysfunction of lumbar region: Secondary | ICD-10-CM | POA: Diagnosis not present

## 2017-04-24 DIAGNOSIS — M9902 Segmental and somatic dysfunction of thoracic region: Secondary | ICD-10-CM | POA: Diagnosis not present

## 2017-05-27 DIAGNOSIS — H02831 Dermatochalasis of right upper eyelid: Secondary | ICD-10-CM | POA: Diagnosis not present

## 2017-05-27 DIAGNOSIS — H02834 Dermatochalasis of left upper eyelid: Secondary | ICD-10-CM | POA: Diagnosis not present

## 2017-05-27 DIAGNOSIS — K219 Gastro-esophageal reflux disease without esophagitis: Secondary | ICD-10-CM | POA: Diagnosis not present

## 2017-05-27 DIAGNOSIS — H02835 Dermatochalasis of left lower eyelid: Secondary | ICD-10-CM | POA: Diagnosis not present

## 2017-05-27 DIAGNOSIS — H02832 Dermatochalasis of right lower eyelid: Secondary | ICD-10-CM

## 2017-05-27 DIAGNOSIS — Z95810 Presence of automatic (implantable) cardiac defibrillator: Secondary | ICD-10-CM | POA: Diagnosis not present

## 2017-05-27 DIAGNOSIS — I472 Ventricular tachycardia: Secondary | ICD-10-CM | POA: Diagnosis not present

## 2017-05-27 DIAGNOSIS — I422 Other hypertrophic cardiomyopathy: Secondary | ICD-10-CM | POA: Diagnosis not present

## 2017-06-14 DIAGNOSIS — J00 Acute nasopharyngitis [common cold]: Secondary | ICD-10-CM | POA: Diagnosis not present

## 2017-06-25 ENCOUNTER — Ambulatory Visit: Payer: BLUE CROSS/BLUE SHIELD | Admitting: Internal Medicine

## 2017-06-25 ENCOUNTER — Encounter: Payer: Self-pay | Admitting: Internal Medicine

## 2017-06-25 VITALS — BP 126/80 | HR 74 | Ht 74.0 in | Wt 202.0 lb

## 2017-06-25 DIAGNOSIS — I422 Other hypertrophic cardiomyopathy: Secondary | ICD-10-CM | POA: Diagnosis not present

## 2017-06-25 DIAGNOSIS — Z4502 Encounter for adjustment and management of automatic implantable cardiac defibrillator: Secondary | ICD-10-CM | POA: Diagnosis not present

## 2017-06-25 LAB — CUP PACEART INCLINIC DEVICE CHECK
Battery Remaining Longevity: 70 mo
Brady Statistic RA Percent Paced: 0.12 %
Date Time Interrogation Session: 20190213154739
HighPow Impedance: 85.5 Ohm
Implantable Lead Implant Date: 20150625
Implantable Lead Implant Date: 20150625
Implantable Lead Location: 753859
Implantable Pulse Generator Implant Date: 20150625
Lead Channel Impedance Value: 475 Ohm
Lead Channel Pacing Threshold Amplitude: 1 V
Lead Channel Pacing Threshold Pulse Width: 0.5 ms
Lead Channel Pacing Threshold Pulse Width: 0.5 ms
Lead Channel Setting Pacing Amplitude: 2 V
Lead Channel Setting Pacing Amplitude: 2.5 V
Lead Channel Setting Pacing Pulse Width: 0.5 ms
Lead Channel Setting Sensing Sensitivity: 0.5 mV
MDC IDC LEAD LOCATION: 753860
MDC IDC MSMT LEADCHNL RA PACING THRESHOLD AMPLITUDE: 0.75 V
MDC IDC MSMT LEADCHNL RA SENSING INTR AMPL: 5 mV
MDC IDC MSMT LEADCHNL RV IMPEDANCE VALUE: 550 Ohm
MDC IDC MSMT LEADCHNL RV SENSING INTR AMPL: 12 mV
MDC IDC PG SERIAL: 7199672
MDC IDC STAT BRADY RV PERCENT PACED: 0 %

## 2017-06-25 NOTE — Patient Instructions (Addendum)
Medication Instructions:  Your physician recommends that you continue on your current medications as directed. Please refer to the Current Medication list given to you today.  *If you need a refill on your cardiac medications before your next appointment, please call your pharmacy*  Labwork: None ordered  Testing/Procedures: None ordered  Follow-Up: Remote monitoring is used to monitor your Pacemaker or ICD from home. This monitoring reduces the number of office visits required to check your device to one time per year. It allows us to keep an eye on the functioning of your device to ensure it is working properly. You are scheduled for a device check from home on 09/24/2017. You may send your transmission at any time that day. If you have a wireless device, the transmission will be sent automatically. After your physician reviews your transmission, you will receive a postcard with your next transmission date.  Your physician wants you to follow-up in: 1 year with Gypsy BalsamAmber Seiler, NP.  You will receive a reminder letter in the mail two months in advance. If you don't receive a letter, please call our office to schedule the follow-up appointment.  Thank you for choosing [facility]!!    Any Other Special Instructions Will Be Listed Below (If Applicable).

## 2017-06-25 NOTE — Progress Notes (Signed)
PCP: Hillis Range, MD Primary Cardiologist:  Dr [name] Also Sees Dr Regino Schultze at [facility] Primary EP: Dr [name]  David Booker is a 42 y.o. male who presents today for routine electrophysiology followup.  Since last being seen in our clinic, the patient reports doing very well.  Today, he denies symptoms of palpitations, chest pain, shortness of breath,  lower extremity edema, dizziness, presyncope, syncope, or ICD shocks.  The patient is otherwise without complaint today.   Past Medical History:  Diagnosis Date  . Abnormal heart rhythm   . Allergic rhinitis, cause unspecified   . Anxiety   . Back pain    without radiation  . Bradycardia    Noted on event monitor (04/20/12-05/20/12) Just before bradycardia was recorded, HR was approx. 130bpm  . Depressive disorder, not elsewhere classified   . Esophageal reflux    Chronic but his current pain appears to be different than his radiating to the back. Possible biliary disease. (04/22/13 Dr. Carman Ching, GI)  . Genital HSV   . Heartburn    Long Hx  . Near syncope    While wearing event monitor (04/20/12-05/20/12) 46bpm. Also had chest tightness at this time. Possible vagal ([place] 05/22/12)  . Other specified circulatory system disorders   . Palpitations   . PVC (premature ventricular contraction)    Noted on event monitor (04/20/12-05/20/12)   . Substernal chest pain 04/22/13   Pt reports having vague symptoms of substernal CP, different from heartburn in that it is dull & radiatesto the back. Occurring at least 3 episodes over past several weeks. Pain free in between episodes. (04/22/13 Dr. Carman Ching GI)   . Syncope   . Syncope and collapse   . Tachycardia, unspecified   . Viral syndrome   . Wide QRS ventricular tachycardia (HCC)    Noted on event monitor (04/20/12-05/20/12) at night   Past Surgical History:  Procedure Laterality Date  . CARDIAC DEFIBRILLATOR PLACEMENT  11/04/13   SJM Fortify Assura DR implanted by Dr [name]  for hypertrophic CM and syncope  . IMPLANTABLE CARDIOVERTER DEFIBRILLATOR IMPLANT N/A 11/04/2013   Procedure: IMPLANTABLE CARDIOVERTER DEFIBRILLATOR IMPLANT;  Surgeon: Gardiner Rhyme, MD;  Location: [facility] CATH LAB;  Service: Cardiovascular;  Laterality: N/A;  . SHOULDER SURGERY Right    Dislocated    ROS- all systems are reviewed and negative except as per HPI above  Current Outpatient Medications  Medication Sig Dispense Refill  . ALPRAZolam (XANAX) 0.5 MG tablet Take 0.5 mg by mouth daily as needed for anxiety.     Marland Kitchen buPROPion (WELLBUTRIN XL) 150 MG 24 hr tablet Take 150 mg by mouth daily with lunch.    Marland Kitchen FLUoxetine (PROZAC) 20 MG capsule Take 20 mg by mouth daily.    . metoprolol succinate (TOPROL-XL) 25 MG 24 hr tablet Take 25 mg by mouth daily.    . Omeprazole Magnesium (PRILOSEC OTC PO) Take 1 tablet by mouth daily.     . valACYclovir (VALTREX) 500 MG tablet Take 500 mg by mouth daily.      No current facility-administered medications for this visit.     Physical Exam: Vitals:   06/25/17 1111  BP: 126/80  Pulse: 74  Weight: 202 lb (91.6 kg)  Height: 6\' 2"  (1.88 m)    GEN- The patient is well appearing, alert and oriented x 3 today.   Head- normocephalic, atraumatic Eyes-  Sclera clear, conjunctiva pink Ears- hearing intact Oropharynx- clear Lungs- Clear to ausculation bilaterally, normal work of breathing  Chest- ICD pocket is well healed Heart- Regular rate and rhythm, no murmurs, rubs or gallops, PMI not laterally displaced GI- soft, NT, ND, + BS Extremities- no clubbing, cyanosis, or edema  ICD interrogation- reviewed in detail today,  See PACEART report  ekg tracing ordered today is personally reviewed and shows sinus rhythm 74 bpm, PR 208 msec, QRS 88 msec, Qtc 419 msec, inferior TWI  Assessment and Plan:  1.  Hypertrophic CM with prior syncope Stable on an appropriate medical regimen Followed by Dr Regino SchultzeWang Normal ICD function See [name] No changes  today I have discussed SJM Fortify Assura advisary with the patient today. He understands that recommendation from SJM is to not replace the device at this time. The patient is not device dependant.  The patient has not had appropriate device therapy in the past or implanted for secondary prevention.  Vibratory alert demonstrated today.  He is actively remotely monitored and understands the importance of compliance today.    Merlin Return to see EP NP in 1 year  [name] MD, Butler Memorial HospitalFACC 06/25/2017 11:32 AM

## 2017-06-27 NOTE — Addendum Note (Signed)
Addended by: Micki RileySHOFFNER, Gabriellah Rabel C on: 06/27/2017 10:14 AM   Modules accepted: Orders

## 2017-06-29 DIAGNOSIS — R404 Transient alteration of awareness: Secondary | ICD-10-CM | POA: Diagnosis not present

## 2017-06-29 DIAGNOSIS — R42 Dizziness and giddiness: Secondary | ICD-10-CM | POA: Diagnosis not present

## 2017-06-30 ENCOUNTER — Telehealth: Payer: Self-pay | Admitting: Internal Medicine

## 2017-06-30 NOTE — Telephone Encounter (Signed)
°  1. Has your device fired? no 2. Is you device beeping? no 3. Are you experiencing draining or swelling at device site? no 4. Are you calling to see if we received your device transmission? Yes   6:40 pm 06/29/2017  5. Have you passed out? no   Please route to Device Clinic Pool

## 2017-06-30 NOTE — Telephone Encounter (Signed)
Mr. David Booker reports that yesterday at about 6:40pm he got dizzy and didn't think it was heart related but he wanted to make sure he didn't have an episode. Transmission received and reviewed. No episodes, normal device function, SR presenting. Mr. David Booker aware and is appreciative.

## 2017-09-10 DIAGNOSIS — Z0000 Encounter for general adult medical examination without abnormal findings: Secondary | ICD-10-CM | POA: Diagnosis not present

## 2017-09-10 DIAGNOSIS — Z6826 Body mass index (BMI) 26.0-26.9, adult: Secondary | ICD-10-CM | POA: Diagnosis not present

## 2017-09-11 DIAGNOSIS — Z0000 Encounter for general adult medical examination without abnormal findings: Secondary | ICD-10-CM | POA: Diagnosis not present

## 2017-09-24 ENCOUNTER — Ambulatory Visit (INDEPENDENT_AMBULATORY_CARE_PROVIDER_SITE_OTHER): Payer: BLUE CROSS/BLUE SHIELD | Admitting: *Deleted

## 2017-09-24 DIAGNOSIS — I422 Other hypertrophic cardiomyopathy: Secondary | ICD-10-CM

## 2017-09-25 NOTE — Progress Notes (Signed)
Remote ICD transmission.   

## 2017-09-26 ENCOUNTER — Encounter: Payer: Self-pay | Admitting: Cardiology

## 2017-10-07 LAB — CUP PACEART REMOTE DEVICE CHECK
Battery Remaining Longevity: 64 mo
Brady Statistic AP VP Percent: 1 %
Brady Statistic AP VS Percent: 1 %
Brady Statistic AS VS Percent: 99 %
Brady Statistic RA Percent Paced: 1 %
Brady Statistic RV Percent Paced: 1 %
HighPow Impedance: 86 Ohm
HighPow Impedance: 86 Ohm
Implantable Lead Implant Date: 20150625
Implantable Lead Implant Date: 20150625
Implantable Pulse Generator Implant Date: 20150625
Lead Channel Impedance Value: 490 Ohm
Lead Channel Impedance Value: 600 Ohm
Lead Channel Pacing Threshold Amplitude: 0.75 V
Lead Channel Pacing Threshold Amplitude: 1 V
Lead Channel Pacing Threshold Pulse Width: 0.5 ms
Lead Channel Sensing Intrinsic Amplitude: 2.7 mV
Lead Channel Setting Pacing Amplitude: 2.5 V
Lead Channel Setting Pacing Pulse Width: 0.5 ms
Lead Channel Setting Sensing Sensitivity: 0.5 mV
MDC IDC LEAD LOCATION: 753859
MDC IDC LEAD LOCATION: 753860
MDC IDC MSMT BATTERY REMAINING PERCENTAGE: 62 %
MDC IDC MSMT BATTERY VOLTAGE: 2.96 V
MDC IDC MSMT LEADCHNL RA PACING THRESHOLD PULSEWIDTH: 0.5 ms
MDC IDC MSMT LEADCHNL RV SENSING INTR AMPL: 12 mV
MDC IDC PG SERIAL: 7199672
MDC IDC SESS DTM: 20190515060018
MDC IDC SET LEADCHNL RA PACING AMPLITUDE: 2 V
MDC IDC STAT BRADY AS VP PERCENT: 1 %

## 2017-11-18 ENCOUNTER — Telehealth: Payer: Self-pay | Admitting: Cardiology

## 2017-11-18 NOTE — Telephone Encounter (Signed)
LMOVM requesting that pt send manual transmission b/c home monitor has not updated in at least 14 days.    

## 2017-12-25 ENCOUNTER — Ambulatory Visit (INDEPENDENT_AMBULATORY_CARE_PROVIDER_SITE_OTHER): Payer: BLUE CROSS/BLUE SHIELD | Admitting: *Deleted

## 2017-12-25 DIAGNOSIS — I422 Other hypertrophic cardiomyopathy: Secondary | ICD-10-CM

## 2017-12-25 NOTE — Progress Notes (Signed)
Remote ICD transmission.   

## 2018-02-02 LAB — CUP PACEART REMOTE DEVICE CHECK
Battery Remaining Longevity: 60 mo
Battery Remaining Percentage: 59 %
Battery Voltage: 2.96 V
Brady Statistic AP VP Percent: 1 %
Brady Statistic AP VS Percent: 1 %
Brady Statistic AS VP Percent: 1 %
Brady Statistic AS VS Percent: 99 %
Brady Statistic RV Percent Paced: 1 %
Date Time Interrogation Session: 20190814093438
HIGH POWER IMPEDANCE MEASURED VALUE: 81 Ohm
HIGH POWER IMPEDANCE MEASURED VALUE: 81 Ohm
Implantable Lead Implant Date: 20150625
Implantable Lead Implant Date: 20150625
Implantable Lead Location: 753859
Implantable Lead Location: 753860
Implantable Pulse Generator Implant Date: 20150625
Lead Channel Impedance Value: 440 Ohm
Lead Channel Impedance Value: 480 Ohm
Lead Channel Pacing Threshold Amplitude: 0.75 V
Lead Channel Pacing Threshold Pulse Width: 0.5 ms
Lead Channel Pacing Threshold Pulse Width: 0.5 ms
Lead Channel Sensing Intrinsic Amplitude: 4.5 mV
Lead Channel Setting Pacing Amplitude: 2 V
Lead Channel Setting Sensing Sensitivity: 0.5 mV
MDC IDC MSMT LEADCHNL RV PACING THRESHOLD AMPLITUDE: 1 V
MDC IDC MSMT LEADCHNL RV SENSING INTR AMPL: 12 mV
MDC IDC PG SERIAL: 7199672
MDC IDC SET LEADCHNL RV PACING AMPLITUDE: 2.5 V
MDC IDC SET LEADCHNL RV PACING PULSEWIDTH: 0.5 ms
MDC IDC STAT BRADY RA PERCENT PACED: 1 %

## 2018-03-12 DIAGNOSIS — F3341 Major depressive disorder, recurrent, in partial remission: Secondary | ICD-10-CM | POA: Diagnosis not present

## 2018-03-12 DIAGNOSIS — K219 Gastro-esophageal reflux disease without esophagitis: Secondary | ICD-10-CM | POA: Diagnosis not present

## 2018-03-12 DIAGNOSIS — I422 Other hypertrophic cardiomyopathy: Secondary | ICD-10-CM | POA: Diagnosis not present

## 2018-03-12 DIAGNOSIS — F419 Anxiety disorder, unspecified: Secondary | ICD-10-CM | POA: Diagnosis not present

## 2018-03-26 ENCOUNTER — Ambulatory Visit (INDEPENDENT_AMBULATORY_CARE_PROVIDER_SITE_OTHER): Payer: BLUE CROSS/BLUE SHIELD | Admitting: *Deleted

## 2018-03-26 DIAGNOSIS — I422 Other hypertrophic cardiomyopathy: Secondary | ICD-10-CM | POA: Diagnosis not present

## 2018-03-26 NOTE — Progress Notes (Signed)
Remote ICD transmission.   

## 2018-05-09 DIAGNOSIS — J018 Other acute sinusitis: Secondary | ICD-10-CM | POA: Diagnosis not present

## 2018-05-09 DIAGNOSIS — J019 Acute sinusitis, unspecified: Secondary | ICD-10-CM | POA: Diagnosis not present

## 2018-05-26 LAB — CUP PACEART REMOTE DEVICE CHECK
Battery Remaining Longevity: 57 mo
Battery Remaining Percentage: 57 %
Battery Voltage: 2.95 V
Brady Statistic AP VP Percent: 1 %
Brady Statistic AP VS Percent: 1 %
Brady Statistic AS VS Percent: 99 %
Brady Statistic RA Percent Paced: 1 %
Brady Statistic RV Percent Paced: 1 %
Date Time Interrogation Session: 20191113070016
HighPow Impedance: 75 Ohm
HighPow Impedance: 75 Ohm
Implantable Lead Implant Date: 20150625
Implantable Lead Implant Date: 20150625
Implantable Lead Location: 753859
Implantable Lead Location: 753860
Implantable Pulse Generator Implant Date: 20150625
Lead Channel Impedance Value: 410 Ohm
Lead Channel Impedance Value: 460 Ohm
Lead Channel Pacing Threshold Amplitude: 1 V
Lead Channel Pacing Threshold Pulse Width: 0.5 ms
Lead Channel Pacing Threshold Pulse Width: 0.5 ms
Lead Channel Sensing Intrinsic Amplitude: 12 mV
Lead Channel Setting Pacing Amplitude: 2 V
Lead Channel Setting Pacing Amplitude: 2.5 V
Lead Channel Setting Pacing Pulse Width: 0.5 ms
Lead Channel Setting Sensing Sensitivity: 0.5 mV
MDC IDC MSMT LEADCHNL RA PACING THRESHOLD AMPLITUDE: 0.75 V
MDC IDC MSMT LEADCHNL RA SENSING INTR AMPL: 4 mV
MDC IDC PG SERIAL: 7199672
MDC IDC STAT BRADY AS VP PERCENT: 1 %

## 2018-06-04 DIAGNOSIS — M5136 Other intervertebral disc degeneration, lumbar region: Secondary | ICD-10-CM | POA: Diagnosis not present

## 2018-06-04 DIAGNOSIS — M545 Low back pain, unspecified: Secondary | ICD-10-CM | POA: Insufficient documentation

## 2018-06-25 ENCOUNTER — Ambulatory Visit (INDEPENDENT_AMBULATORY_CARE_PROVIDER_SITE_OTHER): Payer: BLUE CROSS/BLUE SHIELD

## 2018-06-25 DIAGNOSIS — I422 Other hypertrophic cardiomyopathy: Secondary | ICD-10-CM

## 2018-06-26 LAB — CUP PACEART REMOTE DEVICE CHECK
Brady Statistic AP VP Percent: 1 %
Brady Statistic AP VS Percent: 1 %
Brady Statistic AS VP Percent: 1 %
Brady Statistic AS VS Percent: 99 %
Date Time Interrogation Session: 20200212070025
HighPow Impedance: 83 Ohm
HighPow Impedance: 83 Ohm
Implantable Lead Implant Date: 20150625
Implantable Lead Location: 753860
Lead Channel Impedance Value: 460 Ohm
Lead Channel Pacing Threshold Amplitude: 1 V
Lead Channel Pacing Threshold Pulse Width: 0.5 ms
Lead Channel Sensing Intrinsic Amplitude: 3.7 mV
Lead Channel Setting Pacing Amplitude: 2 V
Lead Channel Setting Sensing Sensitivity: 0.5 mV
MDC IDC LEAD IMPLANT DT: 20150625
MDC IDC LEAD LOCATION: 753859
MDC IDC MSMT BATTERY REMAINING LONGEVITY: 56 mo
MDC IDC MSMT BATTERY REMAINING PERCENTAGE: 55 %
MDC IDC MSMT BATTERY VOLTAGE: 2.95 V
MDC IDC MSMT LEADCHNL RA PACING THRESHOLD AMPLITUDE: 0.75 V
MDC IDC MSMT LEADCHNL RV IMPEDANCE VALUE: 540 Ohm
MDC IDC MSMT LEADCHNL RV PACING THRESHOLD PULSEWIDTH: 0.5 ms
MDC IDC MSMT LEADCHNL RV SENSING INTR AMPL: 12 mV
MDC IDC PG IMPLANT DT: 20150625
MDC IDC PG SERIAL: 7199672
MDC IDC SET LEADCHNL RV PACING AMPLITUDE: 2.5 V
MDC IDC SET LEADCHNL RV PACING PULSEWIDTH: 0.5 ms
MDC IDC STAT BRADY RA PERCENT PACED: 1 %
MDC IDC STAT BRADY RV PERCENT PACED: 1 %

## 2018-07-07 NOTE — Progress Notes (Signed)
Remote ICD transmission.   

## 2018-09-24 ENCOUNTER — Encounter: Payer: BLUE CROSS/BLUE SHIELD | Admitting: *Deleted

## 2018-09-24 ENCOUNTER — Other Ambulatory Visit: Payer: Self-pay

## 2018-09-25 ENCOUNTER — Telehealth: Payer: Self-pay

## 2018-09-25 NOTE — Telephone Encounter (Signed)
Unable to speak  with patient to remind of missed remote transmission 

## 2018-09-30 ENCOUNTER — Encounter: Payer: Self-pay | Admitting: Cardiology

## 2018-12-23 ENCOUNTER — Ambulatory Visit (INDEPENDENT_AMBULATORY_CARE_PROVIDER_SITE_OTHER): Payer: BC Managed Care – PPO | Admitting: *Deleted

## 2018-12-23 DIAGNOSIS — I422 Other hypertrophic cardiomyopathy: Secondary | ICD-10-CM | POA: Diagnosis not present

## 2018-12-23 LAB — CUP PACEART REMOTE DEVICE CHECK
Battery Remaining Longevity: 51 mo
Battery Remaining Percentage: 50 %
Battery Voltage: 2.95 V
Brady Statistic AP VP Percent: 1 %
Brady Statistic AP VS Percent: 1 %
Brady Statistic AS VP Percent: 1 %
Brady Statistic AS VS Percent: 99 %
Brady Statistic RA Percent Paced: 1 %
Brady Statistic RV Percent Paced: 1 %
Date Time Interrogation Session: 20200812074117
HighPow Impedance: 81 Ohm
HighPow Impedance: 81 Ohm
Implantable Lead Implant Date: 20150625
Implantable Lead Implant Date: 20150625
Implantable Lead Location: 753859
Implantable Lead Location: 753860
Implantable Pulse Generator Implant Date: 20150625
Lead Channel Impedance Value: 430 Ohm
Lead Channel Impedance Value: 510 Ohm
Lead Channel Pacing Threshold Amplitude: 0.75 V
Lead Channel Pacing Threshold Amplitude: 1 V
Lead Channel Pacing Threshold Pulse Width: 0.5 ms
Lead Channel Pacing Threshold Pulse Width: 0.5 ms
Lead Channel Sensing Intrinsic Amplitude: 12 mV
Lead Channel Sensing Intrinsic Amplitude: 4.2 mV
Lead Channel Setting Pacing Amplitude: 2 V
Lead Channel Setting Pacing Amplitude: 2.5 V
Lead Channel Setting Pacing Pulse Width: 0.5 ms
Lead Channel Setting Sensing Sensitivity: 0.5 mV
Pulse Gen Serial Number: 7199672

## 2018-12-31 ENCOUNTER — Encounter: Payer: Self-pay | Admitting: Cardiology

## 2018-12-31 NOTE — Progress Notes (Signed)
Remote ICD transmission.   

## 2019-01-01 DIAGNOSIS — B001 Herpesviral vesicular dermatitis: Secondary | ICD-10-CM | POA: Diagnosis not present

## 2019-01-01 DIAGNOSIS — F3341 Major depressive disorder, recurrent, in partial remission: Secondary | ICD-10-CM | POA: Diagnosis not present

## 2019-01-01 DIAGNOSIS — I422 Other hypertrophic cardiomyopathy: Secondary | ICD-10-CM | POA: Diagnosis not present

## 2019-02-15 ENCOUNTER — Other Ambulatory Visit (HOSPITAL_COMMUNITY): Payer: Self-pay | Admitting: Specialist

## 2019-02-15 DIAGNOSIS — M545 Low back pain, unspecified: Secondary | ICD-10-CM

## 2019-02-22 ENCOUNTER — Ambulatory Visit (HOSPITAL_COMMUNITY)
Admission: RE | Admit: 2019-02-22 | Discharge: 2019-02-22 | Disposition: A | Discharge status: Home / Self Care | Payer: BC Managed Care – PPO | Source: Ambulatory Visit | Attending: Specialist | Admitting: Specialist

## 2019-02-22 ENCOUNTER — Other Ambulatory Visit: Payer: Self-pay

## 2019-02-22 DIAGNOSIS — M545 Low back pain, unspecified: Secondary | ICD-10-CM

## 2019-03-01 DIAGNOSIS — M545 Low back pain: Secondary | ICD-10-CM | POA: Diagnosis not present

## 2019-03-01 DIAGNOSIS — M5136 Other intervertebral disc degeneration, lumbar region: Secondary | ICD-10-CM | POA: Diagnosis not present

## 2019-03-15 DIAGNOSIS — M545 Low back pain: Secondary | ICD-10-CM | POA: Diagnosis not present

## 2019-03-23 DIAGNOSIS — M545 Low back pain: Secondary | ICD-10-CM | POA: Diagnosis not present

## 2019-03-24 ENCOUNTER — Ambulatory Visit (INDEPENDENT_AMBULATORY_CARE_PROVIDER_SITE_OTHER): Payer: BC Managed Care – PPO | Admitting: *Deleted

## 2019-03-24 DIAGNOSIS — I4729 Other ventricular tachycardia: Secondary | ICD-10-CM

## 2019-03-24 DIAGNOSIS — I422 Other hypertrophic cardiomyopathy: Secondary | ICD-10-CM

## 2019-03-24 DIAGNOSIS — I472 Ventricular tachycardia: Secondary | ICD-10-CM

## 2019-03-24 LAB — CUP PACEART REMOTE DEVICE CHECK
Battery Remaining Longevity: 49 mo
Battery Remaining Percentage: 48 %
Battery Voltage: 2.93 V
Brady Statistic AP VP Percent: 1 %
Brady Statistic AP VS Percent: 1 %
Brady Statistic AS VP Percent: 1 %
Brady Statistic AS VS Percent: 99 %
Brady Statistic RA Percent Paced: 1 %
Brady Statistic RV Percent Paced: 1 %
Date Time Interrogation Session: 20201111081338
HighPow Impedance: 78 Ohm
HighPow Impedance: 78 Ohm
Implantable Lead Implant Date: 20150625
Implantable Lead Implant Date: 20150625
Implantable Lead Location: 753859
Implantable Lead Location: 753860
Implantable Pulse Generator Implant Date: 20150625
Lead Channel Impedance Value: 410 Ohm
Lead Channel Impedance Value: 480 Ohm
Lead Channel Pacing Threshold Amplitude: 0.75 V
Lead Channel Pacing Threshold Amplitude: 1.25 V
Lead Channel Pacing Threshold Pulse Width: 0.5 ms
Lead Channel Pacing Threshold Pulse Width: 0.5 ms
Lead Channel Sensing Intrinsic Amplitude: 12 mV
Lead Channel Sensing Intrinsic Amplitude: 3.7 mV
Lead Channel Setting Pacing Amplitude: 2 V
Lead Channel Setting Pacing Amplitude: 2.5 V
Lead Channel Setting Pacing Pulse Width: 0.5 ms
Lead Channel Setting Sensing Sensitivity: 0.5 mV
Pulse Gen Serial Number: 7199672

## 2019-03-29 DIAGNOSIS — M545 Low back pain: Secondary | ICD-10-CM | POA: Diagnosis not present

## 2019-04-15 NOTE — Progress Notes (Signed)
Remote ICD transmission.   

## 2019-06-23 ENCOUNTER — Ambulatory Visit (INDEPENDENT_AMBULATORY_CARE_PROVIDER_SITE_OTHER): Payer: BC Managed Care – PPO | Admitting: *Deleted

## 2019-06-23 DIAGNOSIS — I422 Other hypertrophic cardiomyopathy: Secondary | ICD-10-CM

## 2019-06-23 LAB — CUP PACEART REMOTE DEVICE CHECK
Battery Remaining Longevity: 47 mo
Battery Remaining Percentage: 46 %
Battery Voltage: 2.93 V
Brady Statistic AP VP Percent: 1 %
Brady Statistic AP VS Percent: 1 %
Brady Statistic AS VP Percent: 1 %
Brady Statistic AS VS Percent: 99 %
Brady Statistic RA Percent Paced: 1 %
Brady Statistic RV Percent Paced: 1 %
Date Time Interrogation Session: 20210210034143
HighPow Impedance: 81 Ohm
HighPow Impedance: 81 Ohm
Implantable Lead Implant Date: 20150625
Implantable Lead Implant Date: 20150625
Implantable Lead Location: 753859
Implantable Lead Location: 753860
Implantable Pulse Generator Implant Date: 20150625
Lead Channel Impedance Value: 410 Ohm
Lead Channel Impedance Value: 480 Ohm
Lead Channel Pacing Threshold Amplitude: 0.75 V
Lead Channel Pacing Threshold Amplitude: 1.25 V
Lead Channel Pacing Threshold Pulse Width: 0.5 ms
Lead Channel Pacing Threshold Pulse Width: 0.5 ms
Lead Channel Sensing Intrinsic Amplitude: 12 mV
Lead Channel Sensing Intrinsic Amplitude: 3.6 mV
Lead Channel Setting Pacing Amplitude: 2 V
Lead Channel Setting Pacing Amplitude: 2.5 V
Lead Channel Setting Pacing Pulse Width: 0.5 ms
Lead Channel Setting Sensing Sensitivity: 0.5 mV
Pulse Gen Serial Number: 7199672

## 2019-06-23 NOTE — Progress Notes (Signed)
ICD Remote  

## 2019-09-22 ENCOUNTER — Ambulatory Visit (INDEPENDENT_AMBULATORY_CARE_PROVIDER_SITE_OTHER): Payer: BC Managed Care – PPO | Admitting: *Deleted

## 2019-09-22 DIAGNOSIS — I422 Other hypertrophic cardiomyopathy: Secondary | ICD-10-CM | POA: Diagnosis not present

## 2019-09-22 DIAGNOSIS — I472 Ventricular tachycardia: Secondary | ICD-10-CM

## 2019-09-22 DIAGNOSIS — I4729 Other ventricular tachycardia: Secondary | ICD-10-CM

## 2019-09-22 LAB — CUP PACEART REMOTE DEVICE CHECK
Battery Remaining Longevity: 45 mo
Battery Remaining Percentage: 44 %
Battery Voltage: 2.93 V
Brady Statistic AP VP Percent: 1 %
Brady Statistic AP VS Percent: 1 %
Brady Statistic AS VP Percent: 1 %
Brady Statistic AS VS Percent: 99 %
Brady Statistic RA Percent Paced: 1 %
Brady Statistic RV Percent Paced: 1 %
Date Time Interrogation Session: 20210512020017
HighPow Impedance: 78 Ohm
HighPow Impedance: 78 Ohm
Implantable Lead Implant Date: 20150625
Implantable Lead Implant Date: 20150625
Implantable Lead Location: 753859
Implantable Lead Location: 753860
Implantable Pulse Generator Implant Date: 20150625
Lead Channel Impedance Value: 410 Ohm
Lead Channel Impedance Value: 450 Ohm
Lead Channel Pacing Threshold Amplitude: 0.75 V
Lead Channel Pacing Threshold Amplitude: 1.25 V
Lead Channel Pacing Threshold Pulse Width: 0.5 ms
Lead Channel Pacing Threshold Pulse Width: 0.5 ms
Lead Channel Sensing Intrinsic Amplitude: 12 mV
Lead Channel Sensing Intrinsic Amplitude: 3.2 mV
Lead Channel Setting Pacing Amplitude: 2 V
Lead Channel Setting Pacing Amplitude: 2.5 V
Lead Channel Setting Pacing Pulse Width: 0.5 ms
Lead Channel Setting Sensing Sensitivity: 0.5 mV
Pulse Gen Serial Number: 7199672

## 2019-09-24 NOTE — Progress Notes (Signed)
Remote ICD transmission.   

## 2019-09-26 DIAGNOSIS — I422 Other hypertrophic cardiomyopathy: Secondary | ICD-10-CM | POA: Diagnosis not present

## 2019-11-30 DIAGNOSIS — B001 Herpesviral vesicular dermatitis: Secondary | ICD-10-CM | POA: Diagnosis not present

## 2019-11-30 DIAGNOSIS — I422 Other hypertrophic cardiomyopathy: Secondary | ICD-10-CM | POA: Diagnosis not present

## 2019-11-30 DIAGNOSIS — F3341 Major depressive disorder, recurrent, in partial remission: Secondary | ICD-10-CM | POA: Diagnosis not present

## 2019-12-22 ENCOUNTER — Ambulatory Visit (INDEPENDENT_AMBULATORY_CARE_PROVIDER_SITE_OTHER): Payer: BC Managed Care – PPO | Admitting: *Deleted

## 2019-12-22 DIAGNOSIS — I422 Other hypertrophic cardiomyopathy: Secondary | ICD-10-CM

## 2019-12-23 LAB — CUP PACEART REMOTE DEVICE CHECK
Battery Remaining Longevity: 42 mo
Battery Remaining Percentage: 42 %
Battery Voltage: 2.93 V
Brady Statistic AP VP Percent: 1 %
Brady Statistic AP VS Percent: 1 %
Brady Statistic AS VP Percent: 1 %
Brady Statistic AS VS Percent: 99 %
Brady Statistic RA Percent Paced: 1 %
Brady Statistic RV Percent Paced: 1 %
Date Time Interrogation Session: 20210811043734
HighPow Impedance: 75 Ohm
HighPow Impedance: 75 Ohm
Implantable Lead Implant Date: 20150625
Implantable Lead Implant Date: 20150625
Implantable Lead Location: 753859
Implantable Lead Location: 753860
Implantable Pulse Generator Implant Date: 20150625
Lead Channel Impedance Value: 430 Ohm
Lead Channel Impedance Value: 450 Ohm
Lead Channel Pacing Threshold Amplitude: 0.75 V
Lead Channel Pacing Threshold Amplitude: 1.25 V
Lead Channel Pacing Threshold Pulse Width: 0.5 ms
Lead Channel Pacing Threshold Pulse Width: 0.5 ms
Lead Channel Sensing Intrinsic Amplitude: 11.4 mV
Lead Channel Sensing Intrinsic Amplitude: 3.5 mV
Lead Channel Setting Pacing Amplitude: 2 V
Lead Channel Setting Pacing Amplitude: 2.5 V
Lead Channel Setting Pacing Pulse Width: 0.5 ms
Lead Channel Setting Sensing Sensitivity: 0.5 mV
Pulse Gen Serial Number: 7199672

## 2019-12-27 NOTE — Progress Notes (Signed)
Remote ICD transmission.   

## 2019-12-28 NOTE — Addendum Note (Signed)
Addended by: Geralyn Flash D on: 12/28/2019 08:41 AM   Modules accepted: Level of Service

## 2020-03-22 ENCOUNTER — Ambulatory Visit (INDEPENDENT_AMBULATORY_CARE_PROVIDER_SITE_OTHER): Payer: BC Managed Care – PPO

## 2020-03-22 DIAGNOSIS — I422 Other hypertrophic cardiomyopathy: Secondary | ICD-10-CM | POA: Diagnosis not present

## 2020-03-22 LAB — CUP PACEART REMOTE DEVICE CHECK
Battery Remaining Longevity: 40 mo
Battery Remaining Percentage: 39 %
Battery Voltage: 2.92 V
Brady Statistic AP VP Percent: 1 %
Brady Statistic AP VS Percent: 1 %
Brady Statistic AS VP Percent: 1 %
Brady Statistic AS VS Percent: 99 %
Brady Statistic RA Percent Paced: 1 %
Brady Statistic RV Percent Paced: 1 %
Date Time Interrogation Session: 20211110020017
HighPow Impedance: 83 Ohm
HighPow Impedance: 83 Ohm
Implantable Lead Implant Date: 20150625
Implantable Lead Implant Date: 20150625
Implantable Lead Location: 753859
Implantable Lead Location: 753860
Implantable Pulse Generator Implant Date: 20150625
Lead Channel Impedance Value: 450 Ohm
Lead Channel Impedance Value: 480 Ohm
Lead Channel Pacing Threshold Amplitude: 0.75 V
Lead Channel Pacing Threshold Amplitude: 1.25 V
Lead Channel Pacing Threshold Pulse Width: 0.5 ms
Lead Channel Pacing Threshold Pulse Width: 0.5 ms
Lead Channel Sensing Intrinsic Amplitude: 12 mV
Lead Channel Sensing Intrinsic Amplitude: 3.8 mV
Lead Channel Setting Pacing Amplitude: 2 V
Lead Channel Setting Pacing Amplitude: 2.5 V
Lead Channel Setting Pacing Pulse Width: 0.5 ms
Lead Channel Setting Sensing Sensitivity: 0.5 mV
Pulse Gen Serial Number: 7199672

## 2020-03-23 NOTE — Progress Notes (Signed)
Remote ICD transmission.   

## 2020-06-21 ENCOUNTER — Ambulatory Visit (INDEPENDENT_AMBULATORY_CARE_PROVIDER_SITE_OTHER): Payer: BC Managed Care – PPO

## 2020-06-21 DIAGNOSIS — I422 Other hypertrophic cardiomyopathy: Secondary | ICD-10-CM | POA: Diagnosis not present

## 2020-06-23 LAB — CUP PACEART REMOTE DEVICE CHECK
Battery Remaining Longevity: 38 mo
Battery Remaining Percentage: 38 %
Battery Voltage: 2.92 V
Brady Statistic AP VP Percent: 1 %
Brady Statistic AP VS Percent: 1 %
Brady Statistic AS VP Percent: 1 %
Brady Statistic AS VS Percent: 99 %
Brady Statistic RA Percent Paced: 1 %
Brady Statistic RV Percent Paced: 1 %
Date Time Interrogation Session: 20220209020026
HighPow Impedance: 81 Ohm
HighPow Impedance: 81 Ohm
Implantable Lead Implant Date: 20150625
Implantable Lead Implant Date: 20150625
Implantable Lead Location: 753859
Implantable Lead Location: 753860
Implantable Pulse Generator Implant Date: 20150625
Lead Channel Impedance Value: 450 Ohm
Lead Channel Impedance Value: 480 Ohm
Lead Channel Pacing Threshold Amplitude: 0.75 V
Lead Channel Pacing Threshold Amplitude: 1.25 V
Lead Channel Pacing Threshold Pulse Width: 0.5 ms
Lead Channel Pacing Threshold Pulse Width: 0.5 ms
Lead Channel Sensing Intrinsic Amplitude: 12 mV
Lead Channel Sensing Intrinsic Amplitude: 3.5 mV
Lead Channel Setting Pacing Amplitude: 2 V
Lead Channel Setting Pacing Amplitude: 2.5 V
Lead Channel Setting Pacing Pulse Width: 0.5 ms
Lead Channel Setting Sensing Sensitivity: 0.5 mV
Pulse Gen Serial Number: 7199672

## 2020-06-27 NOTE — Progress Notes (Signed)
Remote ICD transmission.   

## 2020-07-13 DIAGNOSIS — F3341 Major depressive disorder, recurrent, in partial remission: Secondary | ICD-10-CM | POA: Diagnosis not present

## 2020-07-13 DIAGNOSIS — I422 Other hypertrophic cardiomyopathy: Secondary | ICD-10-CM | POA: Diagnosis not present

## 2020-09-07 ENCOUNTER — Ambulatory Visit (HOSPITAL_BASED_OUTPATIENT_CLINIC_OR_DEPARTMENT_OTHER)
Admission: RE | Admit: 2020-09-07 | Discharge: 2020-09-07 | Disposition: A | Discharge status: Home / Self Care | Payer: BC Managed Care – PPO | Source: Ambulatory Visit | Attending: Family Medicine | Admitting: Family Medicine

## 2020-09-07 ENCOUNTER — Other Ambulatory Visit (HOSPITAL_BASED_OUTPATIENT_CLINIC_OR_DEPARTMENT_OTHER)
Admission: RE | Admit: 2020-09-07 | Discharge: 2020-09-07 | Disposition: A | Discharge status: Home / Self Care | Payer: BC Managed Care – PPO | Source: Ambulatory Visit | Attending: Family Medicine | Admitting: Family Medicine

## 2020-09-07 ENCOUNTER — Encounter (HOSPITAL_BASED_OUTPATIENT_CLINIC_OR_DEPARTMENT_OTHER): Payer: Self-pay | Admitting: Family Medicine

## 2020-09-07 ENCOUNTER — Other Ambulatory Visit: Payer: Self-pay

## 2020-09-07 ENCOUNTER — Ambulatory Visit (HOSPITAL_BASED_OUTPATIENT_CLINIC_OR_DEPARTMENT_OTHER): Payer: BC Managed Care – PPO | Admitting: Family Medicine

## 2020-09-07 VITALS — BP 98/68 | HR 88 | Ht 74.0 in | Wt 207.2 lb

## 2020-09-07 DIAGNOSIS — M25562 Pain in left knee: Secondary | ICD-10-CM

## 2020-09-07 DIAGNOSIS — Z7689 Persons encountering health services in other specified circumstances: Secondary | ICD-10-CM | POA: Diagnosis not present

## 2020-09-07 DIAGNOSIS — M25512 Pain in left shoulder: Secondary | ICD-10-CM | POA: Insufficient documentation

## 2020-09-07 DIAGNOSIS — R229 Localized swelling, mass and lump, unspecified: Secondary | ICD-10-CM | POA: Diagnosis not present

## 2020-09-07 DIAGNOSIS — M222X2 Patellofemoral disorders, left knee: Secondary | ICD-10-CM | POA: Insufficient documentation

## 2020-09-07 LAB — CBC WITH DIFFERENTIAL/PLATELET
Abs Immature Granulocytes: 0.02 10*3/uL (ref 0.00–0.07)
Basophils Absolute: 0.1 10*3/uL (ref 0.0–0.1)
Basophils Relative: 1 %
Eosinophils Absolute: 0.2 10*3/uL (ref 0.0–0.5)
Eosinophils Relative: 3 %
HCT: 43.3 % (ref 39.0–52.0)
Hemoglobin: 14.8 g/dL (ref 13.0–17.0)
Immature Granulocytes: 0 %
Lymphocytes Relative: 28 %
Lymphs Abs: 2 10*3/uL (ref 0.7–4.0)
MCH: 28.5 pg (ref 26.0–34.0)
MCHC: 34.2 g/dL (ref 30.0–36.0)
MCV: 83.3 fL (ref 80.0–100.0)
Monocytes Absolute: 0.6 10*3/uL (ref 0.1–1.0)
Monocytes Relative: 8 %
Neutro Abs: 4.3 10*3/uL (ref 1.7–7.7)
Neutrophils Relative %: 60 %
Platelets: 273 10*3/uL (ref 150–400)
RBC: 5.2 MIL/uL (ref 4.22–5.81)
RDW: 12.9 % (ref 11.5–15.5)
WBC: 7.2 10*3/uL (ref 4.0–10.5)
nRBC: 0 % (ref 0.0–0.2)

## 2020-09-07 LAB — COMPREHENSIVE METABOLIC PANEL
ALT: 33 U/L (ref 0–44)
AST: 19 U/L (ref 15–41)
Albumin: 4.8 g/dL (ref 3.5–5.0)
Alkaline Phosphatase: 86 U/L (ref 38–126)
Anion gap: 10 (ref 5–15)
BUN: 22 mg/dL — ABNORMAL HIGH (ref 6–20)
CO2: 29 mmol/L (ref 22–32)
Calcium: 9.5 mg/dL (ref 8.9–10.3)
Chloride: 103 mmol/L (ref 98–111)
Creatinine, Ser: 1.14 mg/dL (ref 0.61–1.24)
GFR, Estimated: >60 mL/min (ref >60)
Glucose, Bld: 102 mg/dL — ABNORMAL HIGH (ref 70–99)
Potassium: 4 mmol/L (ref 3.5–5.1)
Sodium: 142 mmol/L (ref 135–145)
Total Bilirubin: 0.5 mg/dL (ref 0.3–1.2)
Total Protein: 7.2 g/dL (ref 6.5–8.1)

## 2020-09-07 NOTE — Assessment & Plan Note (Signed)
Begin evaluation with initial x-ray imaging Further evaluation and review of imaging at next office visit with neck steps in treatment to be discussed at that time

## 2020-09-07 NOTE — Patient Instructions (Signed)
  Medication Instructions:  Your physician recommends that you continue on your current medications as directed. Please refer to the Current Medication list given to you today. --If you need a refill on any your medications before your next appointment, please call your pharmacy first. If no refills are authorized on file call the office.--  Lab Work: Your physician has recommended that you have lab work today: CBC and Comprehensive Metabolic Panel  If you have labs (blood work) drawn today and your tests are completely normal, you will receive your results only by: Marland Kitchen MyChart Message (if you have MyChart) OR . A phone call from our staff. Please ensure you check your voicemail in the event that you authorized detailed messages to be left on a delegated number. If you have any lab test that is abnormal or we need to change your treatment, we will call you to review the results.  Referrals/Procedures/Imaging: Your physician recommends that you have an X-ray of Left Shoulder and Left Knee.  X-rays use invisible electromagnetic energy beams to produce images of internal tissues, bones, and organs on film or digital media. Standard X-rays are performed for many reasons, including diagnosing tumors or bone injuries.    You may have these images done at the Imaging Center located at [facility] at [facility] on the ground floor, [phone]. They are a walk-in imaging facility. The hours of operation are:   Monday -- 7:30 AM - 5:00 PM Tuesday -- 7:30 AM - 5:00 PM Wednesday -- 7:30 AM - 5:00 PM Thursday -- 7:30 AM - 5:00 PM Friday -- 7:30 AM - 5:00 PM Saturday -- Closed Sunday -- Closed   Follow-Up: Your next appointment:   Your physician recommends that you schedule a follow-up appointment in: 3-4 WEEKS with Dr. [name]  Thanks for letting us be apart of your health journey!!  Primary Care and Sports Medicine   Dr. [name] and Shawna Clamp, DNP, AGNP  We recommend  signing up for the patient portal called "MyChart".  Sign up information is provided on this After Visit Summary.  MyChart is used to connect with patients for Virtual Visits (Telemedicine).  Patients are able to view lab/test results, encounter notes, upcoming appointments, etc.  Non-urgent messages can be sent to your provider as well.   To learn more about what you can do with MyChart, please visit --  [email or web address].

## 2020-09-07 NOTE — Assessment & Plan Note (Signed)
We will begin evaluation with initial x-ray imaging At next visit, reviewed x-ray imaging and more closely examine the shoulder

## 2020-09-07 NOTE — Assessment & Plan Note (Signed)
Uncertain etiology, do not suspect that current swelling is at discrete lymph node that is swollen although it is within the differential.  Also within the differential is inguinal hernia. Will check labs for initial evaluation If these are unremarkable, likely proceed with ultrasound of the area to further evaluate Will continue with close follow-up regarding evaluation of this

## 2020-09-07 NOTE — Progress Notes (Signed)
New Patient Office Visit  Subjective:  Patient ID: David Barrenatrick Newhard, male    DOB: 25-Sep-1975  Age: 45 y.o. MRN: [id number]  CC:  Chief Complaint  Patient presents with  . Establish Care  . Adenopathy    Right lymph node swelling in inguinal area. Patient states that intermittently he has swelling in the area that is uncomfortable but noticeable. Patient states he has noticed the swelling for about 6 months, at worst the pain is 3/10  . left shoulder pain    Pain in the left shoulder that has been consistent for the last 6 months. Patient states he may have injured it but is unaware and the pain is nagging nd never subsides.  Marland Kitchen. left knee pain    Patient states for the last month or so he has noticed an icreased "popping" or "cracking" in his left knee when he is walking up a flight of stairs. Doesn't notice pain when he is walking or bending the knee, only when he is doing up stairs. Patient states he has tried braces that have not helped    HPI David Booker is a 45 year old male presenting to establish in clinic.  He has current concerns related to swelling in inguinal region, left knee pain, left shoulder pain.  Reports past medical history of hypertrophic cardiomyopathy, gastroesophageal reflux disease, anxiety/depression.  Inguinal swelling: First noticed some swelling in the right inguinal/pelvic area about 6 months ago.  Has not noticed any change in size of this area of swelling.  Reports mild discomfort locally.  Has not appreciated any other lumps on his body such as in the armpits or on the neck or collarbones.  Denies any fevers, chills, night sweats.  No change in bowel movements.  Has not noticed any significant change in weight.  Denies any concern for any sexually transmitted infections.  No urinary symptoms reported.  Left knee: Reports that he has noticed some pain for about 4 weeks.  Also with associated clicking/popping of the left knee.  Pain is primarily near  superior aspect of patella.  Does not recall any specific inciting event which triggered the knee pain.  Pain is worse with going up stairs.  Has tried different braces including a neoprene sleeve as well as what sounds like a double hinged knee brace without significant improvement in symptoms.  Denies prior injuries or surgeries to the knee.  No significant swelling appreciated, no locking.  Left shoulder: Reports this pain feels like a bruise.  Localized over the superior aspect of shoulder.  Does not recall any inciting event.  Only feels the pain is noticeable or worse with direct pressure applied over the area, no limitations on range of motion or strength for activities.  Denies any numbness or tingling in the area or down the arm.  Patient is right-handed.  Has had shoulder surgery completed on the right shoulder.  Patient was being treated for anxiety and depression with prior PCP.  Reports being on fluoxetine, bupropion, alprazolam as needed.  Also with history of hypertrophic cardiomyopathy for which he follows with cardiology.  Presently prescribed long-acting metoprolol.  History of GERD for which she takes omeprazole.  Also with history of HSV for which he takes acyclovir.  Past Medical History:  Diagnosis Date  . Abnormal heart rhythm   . Allergic rhinitis, cause unspecified   . Anxiety   . Back pain    without radiation  . Bradycardia    Noted on event monitor (04/20/12-05/20/12) Just before  bradycardia was recorded, HR was approx. 130bpm  . Depressive disorder, not elsewhere classified   . Esophageal reflux    Chronic but his current pain appears to be different than his radiating to the back. Possible biliary disease. (04/22/13 Dr. Carman Ching, GI)  . Genital HSV   . Heartburn    Long Hx  . Near syncope    While wearing event monitor (04/20/12-05/20/12) 46bpm. Also had chest tightness at this time. Possible vagal ([place] 05/22/12)  . Other specified circulatory system  disorders   . Palpitations   . PVC (premature ventricular contraction)    Noted on event monitor (04/20/12-05/20/12)   . Substernal chest pain 04/22/13   Pt reports having vague symptoms of substernal CP, different from heartburn in that it is dull & radiatesto the back. Occurring at least 3 episodes over past several weeks. Pain free in between episodes. (04/22/13 Dr. Carman Ching GI)   . Syncope   . Syncope and collapse   . Tachycardia, unspecified   . Viral syndrome   . Wide QRS ventricular tachycardia (HCC)    Noted on event monitor (04/20/12-05/20/12) at night    Past Surgical History:  Procedure Laterality Date  . CARDIAC DEFIBRILLATOR PLACEMENT  11/04/13   SJM Fortify Assura DR implanted by Dr [name] for hypertrophic CM and syncope  . IMPLANTABLE CARDIOVERTER DEFIBRILLATOR IMPLANT N/A 11/04/2013   Procedure: IMPLANTABLE CARDIOVERTER DEFIBRILLATOR IMPLANT;  Surgeon: Gardiner Rhyme, MD;  Location: [facility] CATH LAB;  Service: Cardiovascular;  Laterality: N/A;  . SHOULDER SURGERY Right    Dislocated    Family History  Problem Relation Age of Onset  . CAD Paternal Uncle        paternal uncle died suddenly, per pt autopsy confirmed MI  . Breast cancer Mother 59  . Hiatal hernia Father   . Cardiomyopathy Father        Hypertrophic cardiomyopathy HOCM  . Anxiety disorder Sister   . Heart disease Paternal Uncle        Has a VAD and IDC  . Cardiomyopathy Paternal Uncle        Hypertrophic cardiomyopathy HOCM  . Diabetes Paternal Grandmother        Hypertrophic cardiomyopathy  . Diabetes Paternal Grandfather   . Cardiomyopathy Paternal Grandfather        Hypertrophic cardiomyopathy HOCM  . Heart murmur Sister   . GER disease Father   . Supraventricular tachycardia Father   . Heart attack Paternal Uncle 51  . Diabetes Paternal Uncle   . Cardiomyopathy Cousin        hypertrophic cardiomyopathy  . Heart disease Cousin        mitral valve repair and heart ablation surgery  .  Macular degeneration Maternal Grandmother   . Scoliosis Mother   . Osteoporosis Mother   . Marfan syndrome Maternal Aunt   . Scoliosis Maternal Aunt   . Mitral valve prolapse Maternal Aunt   . Mitral valve prolapse Maternal Uncle   . Other Maternal Uncle        connective tissue issues  . Mitral valve prolapse Cousin   . Other Cousin        thoracic output surgery  . Other Cousin        thoracic output surgery    Social History   Socioeconomic History  . Marital status: Married    Spouse name: Not on file  . Number of children: Not on file  . Years of education: Not on file  .  Highest education level: Not on file  Occupational History  . Not on file  Tobacco Use  . Smoking status: Never Smoker  . Smokeless tobacco: Never Used  Substance and Sexual Activity  . Alcohol use: Yes    Comment: 1-2 mixed drinks per day  . Drug use: No  . Sexual activity: Yes  Other Topics Concern  . Not on file  Social History Narrative   Lives in [place].  Owns a Teaching laboratory technician.  Married.  No children   Social Determinants of Corporate investment banker Strain: Not on file  Food Insecurity: Not on file  Transportation Needs: Not on file  Physical Activity: Not on file  Stress: Not on file  Social Connections: Not on file  Intimate Partner Violence: Not on file    Objective:   Today's Vitals: BP 98/68   Pulse 88   Ht 6\' 2"  (1.88 m)   Wt 207 lb 3.2 oz (94 kg)   SpO2 97%   BMI 26.60 kg/m   Physical Exam  Pleasant 45 year old male in no acute distress Cardiovascular exam with regular rate and rhythm, no murmurs appreciated Lungs clear to auscultation bilaterally Examination of right inguinal region reveals some soft tissue swelling more so in area near base of penis and less so along usual area for inguinal lymph nodes.  Mild discomfort with palpation, no overlying erythema, no discrete nodules appreciated.  Assessment & Plan:   Problem List Items Addressed This Visit       Other   Soft tissue swelling    Uncertain etiology, do not suspect that current swelling is at discrete lymph node that is swollen although it is within the differential.  Also within the differential is inguinal hernia. Will check labs for initial evaluation If these are unremarkable, likely proceed with ultrasound of the area to further evaluate Will continue with close follow-up regarding evaluation of this      Relevant Orders   Comprehensive metabolic panel (Completed)   CBC with Differential/Platelet (Completed)   Left shoulder pain    We will begin evaluation with initial x-ray imaging At next visit, reviewed x-ray imaging and more closely examine the shoulder      Relevant Orders   DG Shoulder Left   Comprehensive metabolic panel (Completed)   CBC with Differential/Platelet (Completed)   Left knee pain    Begin evaluation with initial x-ray imaging Further evaluation and review of imaging at next office visit with neck steps in treatment to be discussed at that time       Relevant Orders   DG Knee Complete 4 Views Left   Comprehensive metabolic panel (Completed)   CBC with Differential/Platelet (Completed)    Other Visit Diagnoses    Encounter to establish care with new doctor    -  Primary   Relevant Orders   Comprehensive metabolic panel (Completed)   CBC with Differential/Platelet (Completed)      Outpatient Encounter Medications as of 09/07/2020  Medication Sig  . ALPRAZolam (XANAX) 0.5 MG tablet Take 0.5 mg by mouth daily as needed for anxiety.   09/09/2020 buPROPion (WELLBUTRIN XL) 150 MG 24 hr tablet Take 150 mg by mouth daily with lunch.  Marland Kitchen FLUoxetine (PROZAC) 20 MG capsule Take 20 mg by mouth daily.  . metoprolol succinate (TOPROL-XL) 100 MG 24 hr tablet Take 100 mg by mouth daily. Take with or immediately following a meal.  . Omeprazole Magnesium (PRILOSEC OTC PO) Take 1 tablet by mouth daily.   Marland Kitchen  valACYclovir (VALTREX) 500 MG tablet Take 500 mg by mouth daily.    . [DISCONTINUED] metoprolol succinate (TOPROL-XL) 25 MG 24 hr tablet Take 25 mg by mouth daily. (Patient not taking: Reported on 09/07/2020)   No facility-administered encounter medications on file as of 09/07/2020.   Spent 45 minutes on this patient encounter, including preparation, chart review, face-to-face counseling with patient and coordination of care, and documentation of encounter  Follow-up: Return in about 4 weeks (around 10/05/2020) for Follow Up.   Kimyatta Lecy J [name], MD

## 2020-09-08 ENCOUNTER — Other Ambulatory Visit (HOSPITAL_BASED_OUTPATIENT_CLINIC_OR_DEPARTMENT_OTHER): Payer: Self-pay | Admitting: Family Medicine

## 2020-09-11 ENCOUNTER — Telehealth (HOSPITAL_BASED_OUTPATIENT_CLINIC_OR_DEPARTMENT_OTHER): Payer: Self-pay

## 2020-09-11 NOTE — Telephone Encounter (Signed)
-----   Message from Hosie Poisson Peru, MD sent at 09/08/2020  9:30 AM EDT ----- Labs overall reassuring with normal electrolytes, normal kidney and liver function.  White blood cell and red blood cell counts are also normal.  These findings do provide some reassurance in regards to potential etiology of soft tissue swelling.

## 2020-09-11 NOTE — Telephone Encounter (Signed)
Results released by Dr. de [place] and reviewed by patient via MyChart Instructed patient to contact the office with any questions or concerns.  

## 2020-09-20 ENCOUNTER — Ambulatory Visit (INDEPENDENT_AMBULATORY_CARE_PROVIDER_SITE_OTHER): Payer: BC Managed Care – PPO

## 2020-09-20 DIAGNOSIS — I422 Other hypertrophic cardiomyopathy: Secondary | ICD-10-CM | POA: Diagnosis not present

## 2020-09-21 LAB — CUP PACEART REMOTE DEVICE CHECK
Battery Remaining Longevity: 36 mo
Battery Remaining Percentage: 35 %
Battery Voltage: 2.92 V
Brady Statistic AP VP Percent: 1 %
Brady Statistic AP VS Percent: 1 %
Brady Statistic AS VP Percent: 1 %
Brady Statistic AS VS Percent: 99 %
Brady Statistic RA Percent Paced: 1 %
Brady Statistic RV Percent Paced: 1 %
Date Time Interrogation Session: 20220511020709
HighPow Impedance: 80 Ohm
HighPow Impedance: 80 Ohm
Implantable Lead Implant Date: 20150625
Implantable Lead Implant Date: 20150625
Implantable Lead Location: 753859
Implantable Lead Location: 753860
Implantable Pulse Generator Implant Date: 20150625
Lead Channel Impedance Value: 390 Ohm
Lead Channel Impedance Value: 450 Ohm
Lead Channel Pacing Threshold Amplitude: 0.75 V
Lead Channel Pacing Threshold Amplitude: 1.25 V
Lead Channel Pacing Threshold Pulse Width: 0.5 ms
Lead Channel Pacing Threshold Pulse Width: 0.5 ms
Lead Channel Sensing Intrinsic Amplitude: 11.4 mV
Lead Channel Sensing Intrinsic Amplitude: 3.4 mV
Lead Channel Setting Pacing Amplitude: 2 V
Lead Channel Setting Pacing Amplitude: 2.5 V
Lead Channel Setting Pacing Pulse Width: 0.5 ms
Lead Channel Setting Sensing Sensitivity: 0.5 mV
Pulse Gen Serial Number: 7199672

## 2020-10-03 ENCOUNTER — Telehealth (HOSPITAL_BASED_OUTPATIENT_CLINIC_OR_DEPARTMENT_OTHER): Payer: Self-pay | Admitting: Family Medicine

## 2020-10-03 NOTE — Telephone Encounter (Signed)
LVMTCB to resch appt from 5/26 due to provider being sick. Please advise.

## 2020-10-05 ENCOUNTER — Ambulatory Visit (HOSPITAL_BASED_OUTPATIENT_CLINIC_OR_DEPARTMENT_OTHER): Payer: BC Managed Care – PPO | Admitting: Family Medicine

## 2020-10-05 NOTE — Telephone Encounter (Signed)
Pt resch for 6/7

## 2020-10-12 NOTE — Progress Notes (Signed)
Remote ICD transmission.   

## 2020-10-17 ENCOUNTER — Encounter (HOSPITAL_BASED_OUTPATIENT_CLINIC_OR_DEPARTMENT_OTHER): Payer: Self-pay | Admitting: Family Medicine

## 2020-10-17 ENCOUNTER — Ambulatory Visit (HOSPITAL_BASED_OUTPATIENT_CLINIC_OR_DEPARTMENT_OTHER): Payer: BC Managed Care – PPO | Admitting: Family Medicine

## 2020-10-17 ENCOUNTER — Other Ambulatory Visit: Payer: Self-pay

## 2020-10-17 VITALS — BP 106/80 | HR 76 | Ht 72.0 in | Wt 201.8 lb

## 2020-10-17 DIAGNOSIS — M7542 Impingement syndrome of left shoulder: Secondary | ICD-10-CM

## 2020-10-17 DIAGNOSIS — M222X2 Patellofemoral disorders, left knee: Secondary | ICD-10-CM | POA: Diagnosis not present

## 2020-10-17 NOTE — Progress Notes (Signed)
    Procedures performed today:    None.  Independent interpretation of notes and tests performed by another provider:   None.  Brief History, Exam, Impression, and Recommendations:    Nichoals is a 45 year old male presenting for follow-up.  Reports general improvement in issues addressed at last visit including inguinal swelling, left knee pain, left shoulder pain.  Still have some left knee crepitus at times, no pain with this.  Has been using knee brace, conservative measures.  Patient has adjusted abilities using at home and feels that this helps with his left shoulder pain at night.  X-rays completed since last visit.  BP 106/80   Pulse 76   Ht 6' (1.829 m)   Wt 201 lb 12.8 oz (91.5 kg)   SpO2 100%   BMI 27.37 kg/m   Left shoulder: No tenderness to palpation over AC joint, bicipital groove Normal range of motion Normal strength for internal rotation, external rotation, abduction Mild pain with empty can test Mild pain with Hawkins Negative Neer's test Neurovascularly intact distally  Left knee: No obvious deformity. No effusion.  Negative patellar grind.  Positive crepitus. Full ROM for flexion and extension.  Strength 5 out of 5 for flexion and extension. Anterior drawer: Negative Posterior drawer: Negative Lachman: Negative Varus stress test: Negative Valgus stress test: Negative Neurovascularly intact.  No evidence of lymphatic disease.  Shoulder impingement syndrome, left Suspect underlying impingement syndrome Discussed recommended treatment including conservative measures, home exercise program, referral to PT Handout provided with home exercises, hold off on formal physical therapy at this time  Patellofemoral pain syndrome of left knee Improved since last visit Suspect PFPS as underlying cause for knee symptoms Handout provided covering home exercises Can continue with knee brace as desired If worsening is noted, consider formal physical therapy for  further evaluation and treatment  Plan for follow-up in about 6 months or sooner as needed   ___________________________________________ Lanny Donoso [name], MD, ABFM, CAQSM Primary Care and Sports Medicine [facility]

## 2020-10-17 NOTE — Assessment & Plan Note (Signed)
Improved since last visit Suspect PFPS as underlying cause for knee symptoms Handout provided covering home exercises Can continue with knee brace as desired If worsening is noted, consider formal physical therapy for further evaluation and treatment

## 2020-10-17 NOTE — Assessment & Plan Note (Signed)
Suspect underlying impingement syndrome Discussed recommended treatment including conservative measures, home exercise program, referral to PT Handout provided with home exercises, hold off on formal physical therapy at this time

## 2020-10-17 NOTE — Patient Instructions (Addendum)
Medication Instructions:  Your physician recommends that you continue on your current medications as directed. Please refer to the Current Medication list given to you today. --If you need a refill on any your medications before your next appointment, please call your pharmacy first. If no refills are authorized on file call the office.--  Follow-Up: Your next appointment:   Your physician recommends that you schedule a follow-up appointment in: 6 MONTHS with Dr. [name]  Thanks for letting us be apart of your health journey!!  Primary Care and Sports Medicine   Dr. [name] and Shawna Clamp, DNP, AGNP  We recommend signing up for the patient portal called "MyChart".  Sign up information is provided on this After Visit Summary.  MyChart is used to connect with patients for Virtual Visits (Telemedicine).  Patients are able to view lab/test results, encounter notes, upcoming appointments, etc.  Non-urgent messages can be sent to your provider as well.   To learn more about what you can do with MyChart, please visit --  [email or web address].     Shoulder Impingement Syndrome Rehab Ask your health care provider which exercises are safe for you. Do exercises exactly as told by your health care provider and adjust them as directed. It is normal to feel mild stretching, pulling, tightness, or discomfort as you do these exercises. Stop right away if you feel sudden pain or your pain gets worse. Do not begin these exercises until told by your health care provider. Stretching and range-of-motion exercise This exercise warms up your muscles and joints and improves the movement and flexibility of your shoulder. This exercise also helps to relieve pain and stiffness. Passive horizontal adduction In passive adduction, you use your other hand to move the injured arm toward your body. The injured arm does not move on its own. In this movement, your arm is moved across your body in the horizontal plane  (horizontal adduction). 1. Sit or stand and pull your left / right elbow across your chest, toward your other shoulder. Stop when you feel a gentle stretch in the back of your shoulder and upper arm. ? Keep your arm at shoulder height. ? Keep your arm as close to your body as you comfortably can. 2. Hold for __________ seconds. 3. Slowly return to the starting position. Repeat __________ times. Complete this exercise __________ times a day.   Strengthening exercises These exercises build strength and endurance in your shoulder. Endurance is the ability to use your muscles for a long time, even after they get tired. External rotation, isometric This is an exercise in which you press the back of your wrist against a door frame without moving your shoulder joint (isometric). 1. Stand or sit in a doorway, facing the door frame. 2. Bend your left / right elbow and place the back of your wrist against the door frame. Only the back of your wrist should be touching the frame. Keep your upper arm at your side. 3. Gently press your wrist against the door frame, as if you are trying to push your arm away from your abdomen (external rotation). Press as hard as you are able without pain. ? Avoid shrugging your shoulder while you press your wrist against the door frame. Keep your shoulder blade tucked down toward the middle of your back. 4. Hold for __________ seconds. 5. Slowly release the tension, and relax your muscles completely before you repeat the exercise. Repeat __________ times. Complete this exercise __________ times a day. Internal rotation, isometric  This is an exercise in which you press your palm against a door frame without moving your shoulder joint (isometric). 1. Stand or sit in a doorway, facing the door frame. 2. Bend your left / right elbow and place the palm of your hand against the door frame. Only your palm should be touching the frame. Keep your upper arm at your side. 3. Gently  press your hand against the door frame, as if you are trying to push your arm toward your abdomen (internal rotation). Press as hard as you are able without pain. ? Avoid shrugging your shoulder while you press your hand against the door frame. Keep your shoulder blade tucked down toward the middle of your back. 4. Hold for __________ seconds. 5. Slowly release the tension, and relax your muscles completely before you repeat the exercise. Repeat __________ times. Complete this exercise __________ times a day.   Scapular protraction, supine 1. Lie on your back on a firm surface (supine position). Hold a __________ weight in your left / right hand. 2. Raise your left / right arm straight into the air so your hand is directly above your shoulder joint. 3. Push the weight into the air so your shoulder (scapula) lifts off the surface that you are lying on. The scapula will push up or forward (protraction). Do not move your head, neck, or back. 4. Hold for __________ seconds. 5. Slowly return to the starting position. Let your muscles relax completely before you repeat this exercise. Repeat __________ times. Complete this exercise __________ times a day.   Scapular retraction 1. Sit in a stable chair without armrests, or stand up. 2. Secure an exercise band to a stable object in front of you so the band is at shoulder height. 3. Hold one end of the exercise band in each hand. Your palms should face down. 4. Squeeze your shoulder blades together (retraction) and move your elbows slightly behind you. Do not shrug your shoulders upward while you do this. 5. Hold for __________ seconds. 6. Slowly return to the starting position. Repeat __________ times. Complete this exercise __________ times a day.   Shoulder extension 1. Sit in a stable chair without armrests, or stand up. 2. Secure an exercise band to a stable object in front of you so the band is above shoulder height. 3. Hold one end of the  exercise band in each hand. 4. Straighten your elbows and lift your hands up to shoulder height. 5. Squeeze your shoulder blades together and pull your hands down to the sides of your thighs (extension). Stop when your hands are straight down by your sides. Do not let your hands go behind your body. 6. Hold for __________ seconds. 7. Slowly return to the starting position. Repeat __________ times. Complete this exercise __________ times a day.   This information is not intended to replace advice given to you by your health care provider. Make sure you discuss any questions you have with your health care provider. Document Revised: 08/21/2018 Document Reviewed: 05/25/2018 Elsevier Patient Education  2021 [employer]   Knee Exercises Ask your health care provider which exercises are safe for you. Do exercises exactly as told by your health care provider and adjust them as directed. It is normal to feel mild stretching, pulling, tightness, or discomfort as you do these exercises. Stop right away if you feel sudden pain or your pain gets worse. Do not begin these exercises until told by your health care provider. Stretching and range-of-motion  exercises These exercises warm up your muscles and joints and improve the movement and flexibility of your knee. These exercises also help to relieve pain and swelling. Knee extension, prone 1. Lie on your abdomen (prone position) on a bed. 2. Place your left / right knee just beyond the edge of the surface so your knee is not on the bed. You can put a towel under your left / right thigh just above your kneecap for comfort. 3. Relax your leg muscles and allow gravity to straighten your knee (extension). You should feel a stretch behind your left / right knee. 4. Hold this position for __________ seconds. 5. Scoot up so your knee is supported between repetitions. Repeat __________ times. Complete this exercise __________ times a day. Knee flexion,  active 1. Lie on your back with both legs straight. If this causes back discomfort, bend your left / right knee so your foot is flat on the floor. 2. Slowly slide your left / right heel back toward your buttocks. Stop when you feel a gentle stretch in the front of your knee or thigh (flexion). 3. Hold this position for __________ seconds. 4. Slowly slide your left / right heel back to the starting position. Repeat __________ times. Complete this exercise __________ times a day.   Quadriceps stretch, prone 1. Lie on your abdomen on a firm surface, such as a bed or padded floor. 2. Bend your left / right knee and hold your ankle. If you cannot reach your ankle or pant leg, loop a belt around your foot and grab the belt instead. 3. Gently pull your heel toward your buttocks. Your knee should not slide out to the side. You should feel a stretch in the front of your thigh and knee (quadriceps). 4. Hold this position for __________ seconds. Repeat __________ times. Complete this exercise __________ times a day.   Hamstring, supine 1. Lie on your back (supine position). 2. Loop a belt or towel over the ball of your left / right foot. The ball of your foot is on the walking surface, right under your toes. 3. Straighten your left / right knee and slowly pull on the belt to raise your leg until you feel a gentle stretch behind your knee (hamstring). ? Do not let your knee bend while you do this. ? Keep your other leg flat on the floor. 4. Hold this position for __________ seconds. Repeat __________ times. Complete this exercise __________ times a day. Strengthening exercises These exercises build strength and endurance in your knee. Endurance is the ability to use your muscles for a long time, even after they get tired. Quadriceps, isometric This exercise stretches the muscles in front of your thigh (quadriceps) without moving your knee joint (isometric). 1. Lie on your back with your left / right leg  extended and your other knee bent. Put a rolled towel or small pillow under your knee if told by your health care provider. 2. Slowly tense the muscles in the front of your left / right thigh. You should see your kneecap slide up toward your hip or see increased dimpling just above the knee. This motion will push the back of the knee toward the floor. 3. For __________ seconds, hold the muscle as tight as you can without increasing your pain. 4. Relax the muscles slowly and completely. Repeat __________ times. Complete this exercise __________ times a day.   Straight leg raises This exercise stretches the muscles in front of your thigh (quadriceps) and  the muscles that move your hips (hip flexors). 1. Lie on your back with your left / right leg extended and your other knee bent. 2. Tense the muscles in the front of your left / right thigh. You should see your kneecap slide up or see increased dimpling just above the knee. Your thigh may even shake a bit. 3. Keep these muscles tight as you raise your leg 4-6 inches (10-15 cm) off the floor. Do not let your knee bend. 4. Hold this position for __________ seconds. 5. Keep these muscles tense as you lower your leg. 6. Relax your muscles slowly and completely after each repetition. Repeat __________ times. Complete this exercise __________ times a day. Hamstring, isometric 1. Lie on your back on a firm surface. 2. Bend your left / right knee about __________ degrees. 3. Dig your left / right heel into the surface as if you are trying to pull it toward your buttocks. Tighten the muscles in the back of your thighs (hamstring) to "dig" as hard as you can without increasing any pain. 4. Hold this position for __________ seconds. 5. Release the tension gradually and allow your muscles to relax completely for __________ seconds after each repetition. Repeat __________ times. Complete this exercise __________ times a day. Hamstring curls If told by your  health care provider, do this exercise while wearing ankle weights. Begin with __________ lb weights. Then increase the weight by 1 lb (0.5 kg) increments. Do not wear ankle weights that are more than __________ lb. 1. Lie on your abdomen with your legs straight. 2. Bend your left / right knee as far as you can without feeling pain. Keep your hips flat against the floor. 3. Hold this position for __________ seconds. 4. Slowly lower your leg to the starting position. Repeat __________ times. Complete this exercise __________ times a day.   Squats This exercise strengthens the muscles in front of your thigh and knee (quadriceps). 1. Stand in front of a table, with your feet and knees pointing straight ahead. You may rest your hands on the table for balance but not for support. 2. Slowly bend your knees and lower your hips like you are going to sit in a chair. ? Keep your weight over your heels, not over your toes. ? Keep your lower legs upright so they are parallel with the table legs. ? Do not let your hips go lower than your knees. ? Do not bend lower than told by your health care provider. ? If your knee pain increases, do not bend as low. 3. Hold the squat position for __________ seconds. 4. Slowly push with your legs to return to standing. Do not use your hands to pull yourself to standing. Repeat __________ times. Complete this exercise __________ times a day. Wall slides This exercise strengthens the muscles in front of your thigh and knee (quadriceps). 1. Lean your back against a smooth wall or door, and walk your feet out 18-24 inches (46-61 cm) from it. 2. Place your feet hip-width apart. 3. Slowly slide down the wall or door until your knees bend __________ degrees. Keep your knees over your heels, not over your toes. Keep your knees in line with your hips. 4. Hold this position for __________ seconds. Repeat __________ times. Complete this exercise __________ times a day.    Straight leg raises This exercise strengthens the muscles that rotate the leg at the hip and move it away from your body (hip abductors). 1. Lie on your  side with your left / right leg in the top position. Lie so your head, shoulder, knee, and hip line up. You may bend your bottom knee to help you keep your balance. 2. Roll your hips slightly forward so your hips are stacked directly over each other and your left / right knee is facing forward. 3. Leading with your heel, lift your top leg 4-6 inches (10-15 cm). You should feel the muscles in your outer hip lifting. ? Do not let your foot drift forward. ? Do not let your knee roll toward the ceiling. 4. Hold this position for __________ seconds. 5. Slowly return your leg to the starting position. 6. Let your muscles relax completely after each repetition. Repeat __________ times. Complete this exercise __________ times a day.   Straight leg raises This exercise stretches the muscles that move your hips away from the front of the pelvis (hip extensors). 1. Lie on your abdomen on a firm surface. You can put a pillow under your hips if that is more comfortable. 2. Tense the muscles in your buttocks and lift your left / right leg about 4-6 inches (10-15 cm). Keep your knee straight as you lift your leg. 3. Hold this position for __________ seconds. 4. Slowly lower your leg to the starting position. 5. Let your leg relax completely after each repetition. Repeat __________ times. Complete this exercise __________ times a day. This information is not intended to replace advice given to you by your health care provider. Make sure you discuss any questions you have with your health care provider. Document Revised: 02/17/2018 Document Reviewed: 02/17/2018 Elsevier Patient Education  2021 [employer].

## 2020-12-20 ENCOUNTER — Ambulatory Visit (HOSPITAL_BASED_OUTPATIENT_CLINIC_OR_DEPARTMENT_OTHER): Payer: BC Managed Care – PPO | Admitting: Family Medicine

## 2020-12-20 ENCOUNTER — Ambulatory Visit (INDEPENDENT_AMBULATORY_CARE_PROVIDER_SITE_OTHER): Payer: BC Managed Care – PPO

## 2020-12-20 DIAGNOSIS — I422 Other hypertrophic cardiomyopathy: Secondary | ICD-10-CM

## 2020-12-21 ENCOUNTER — Encounter (HOSPITAL_BASED_OUTPATIENT_CLINIC_OR_DEPARTMENT_OTHER): Payer: Self-pay | Admitting: Family Medicine

## 2020-12-21 ENCOUNTER — Ambulatory Visit (HOSPITAL_BASED_OUTPATIENT_CLINIC_OR_DEPARTMENT_OTHER): Payer: BC Managed Care – PPO | Admitting: Family Medicine

## 2020-12-21 ENCOUNTER — Other Ambulatory Visit: Payer: Self-pay

## 2020-12-21 VITALS — BP 124/84 | HR 74 | Ht 72.0 in | Wt 195.0 lb

## 2020-12-21 DIAGNOSIS — M7542 Impingement syndrome of left shoulder: Secondary | ICD-10-CM | POA: Diagnosis not present

## 2020-12-21 DIAGNOSIS — M222X2 Patellofemoral disorders, left knee: Secondary | ICD-10-CM | POA: Diagnosis not present

## 2020-12-21 LAB — CUP PACEART REMOTE DEVICE CHECK
Battery Remaining Longevity: 33 mo
Battery Remaining Percentage: 33 %
Battery Voltage: 2.9 V
Brady Statistic AP VP Percent: 1 %
Brady Statistic AP VS Percent: 1 %
Brady Statistic AS VP Percent: 1 %
Brady Statistic AS VS Percent: 99 %
Brady Statistic RA Percent Paced: 1 %
Brady Statistic RV Percent Paced: 1 %
Date Time Interrogation Session: 20220810023632
HighPow Impedance: 75 Ohm
HighPow Impedance: 75 Ohm
Implantable Lead Implant Date: 20150625
Implantable Lead Implant Date: 20150625
Implantable Lead Location: 753859
Implantable Lead Location: 753860
Implantable Pulse Generator Implant Date: 20150625
Lead Channel Impedance Value: 430 Ohm
Lead Channel Impedance Value: 460 Ohm
Lead Channel Pacing Threshold Amplitude: 0.75 V
Lead Channel Pacing Threshold Amplitude: 1.25 V
Lead Channel Pacing Threshold Pulse Width: 0.5 ms
Lead Channel Pacing Threshold Pulse Width: 0.5 ms
Lead Channel Sensing Intrinsic Amplitude: 11.9 mV
Lead Channel Sensing Intrinsic Amplitude: 4.1 mV
Lead Channel Setting Pacing Amplitude: 2 V
Lead Channel Setting Pacing Amplitude: 2.5 V
Lead Channel Setting Pacing Pulse Width: 0.5 ms
Lead Channel Setting Sensing Sensitivity: 0.5 mV
Pulse Gen Serial Number: 7199672

## 2020-12-21 NOTE — Assessment & Plan Note (Signed)
Reports that he is doing well since last visit, has been managing with OTC measures, home exercises No concerns today regarding knee pain Plan to continue monitoring, follow-up as needed regarding this issue

## 2020-12-21 NOTE — Patient Instructions (Signed)
  Medication Instructions:  Your physician recommends that you continue on your current medications as directed. Please refer to the Current Medication list given to you today. --If you need a refill on any your medications before your next appointment, please call your pharmacy first. If no refills are authorized on file call the office.--  Referrals/Procedures/Imaging: A referral has been placed for you to go to Outpatient Physical Therapy at [facility]. Someone from the scheduling department will be in contact with you in regards to coordinating your consultation. If you do not hear from any of the schedulers within 7-10 business days please give our office a call.  [facility] [address] Located on the 1st Floor Hours of Operation: Monday - Friday 8:00 am - 5:00 pm Closed on weekends and all major holidays (P) [phone]  Follow-Up: Your next appointment:   Your physician recommends that you schedule a follow-up appointment in: 6 WEEKS with Dr. de [place]  Thanks for letting us be apart of your health journey!!  Primary Care and Sports Medicine   Dr. Raymond de [place]   We encourage you to activate your patient portal called "MyChart".  Sign up information is provided on this After Visit Summary.  MyChart is used to connect with patients for Virtual Visits (Telemedicine).  Patients are able to view lab/test results, encounter notes, upcoming appointments, etc.  Non-urgent messages can be sent to your provider as well. To learn more about what you can do with MyChart, please visit --  [email or web address]    

## 2020-12-21 NOTE — Assessment & Plan Note (Addendum)
Still with some left shoulder pain, has been doing home exercises, no worsening with completing exercises but has not noticed significant improvement since last visit Occasionally shoulder will feel unstable.  He is also noticed that there is a area over the superior aspect of the shoulder that seems to be more prominent as compared to right side and occasionally this will be tender.  Area indicated seems to be near [facility] joint Will have pain at times when sleeping at night, particular when sleeping on affected side Using NSAID to help with pain control On exam, still with normal strength and range of motion.  Does have pain with empty can, [place].  Neer's test is negative.  Does have some pain with cross body movement of arm. Discussed options with patient, will refer to physical therapy, advised on home exercise program as per PT Plan to follow-up in about 6 weeks to monitor progress If not improving expected, consider advanced imaging

## 2020-12-21 NOTE — Progress Notes (Signed)
    Procedures performed today:    None.  Independent interpretation of notes and tests performed by another provider:   None.  Brief History, Exam, Impression, and Recommendations:    BP 124/84   Pulse 74   Ht 6' (1.829 m)   Wt 195 lb (88.5 kg)   SpO2 98%   BMI 26.45 kg/m   Shoulder impingement syndrome, left Still with some left shoulder pain, has been doing home exercises, no worsening with completing exercises but has not noticed significant improvement since last visit Occasionally shoulder will feel unstable.  He is also noticed that there is a area over the superior aspect of the shoulder that seems to be more prominent as compared to right side and occasionally this will be tender.  Area indicated seems to be near [facility] joint Will have pain at times when sleeping at night, particular when sleeping on affected side Using NSAID to help with pain control On exam, still with normal strength and range of motion.  Does have pain with empty can, [place].  Neer's test is negative.  Does have some pain with cross body movement of arm. Discussed options with patient, will refer to physical therapy, advised on home exercise program as per PT Plan to follow-up in about 6 weeks to monitor progress If not improving expected, consider advanced imaging  Patellofemoral pain syndrome of left knee Reports that he is doing well since last visit, has been managing with OTC measures, home exercises No concerns today regarding knee pain Plan to continue monitoring, follow-up as needed regarding this issue    ___________________________________________ Anusha Claus [name], MD, ABFM, CAQSM Primary Care and Sports Medicine [facility]

## 2020-12-28 ENCOUNTER — Encounter (HOSPITAL_BASED_OUTPATIENT_CLINIC_OR_DEPARTMENT_OTHER): Payer: Self-pay | Admitting: Family Medicine

## 2020-12-28 DIAGNOSIS — M06312 Rheumatoid nodule, left shoulder: Secondary | ICD-10-CM

## 2021-01-01 NOTE — Addendum Note (Signed)
Addended by: Dareen Piano on: 01/01/2021 01:14 PM   Modules accepted: Orders

## 2021-01-03 MED ORDER — BUPROPION HCL ER (XL) 150 MG PO TB24: 150.0000 mg | ORAL_TABLET | Freq: Every day | ORAL | 1 refills | Status: DC

## 2021-01-03 MED ORDER — METOPROLOL SUCCINATE ER 100 MG PO TB24: 100.0000 mg | ORAL_TABLET | Freq: Every day | ORAL | 0 refills | Status: DC

## 2021-01-03 MED ORDER — ALPRAZOLAM 0.5 MG PO TABS: 0.5000 mg | ORAL_TABLET | Freq: Every day | ORAL | 0 refills | Status: DC | PRN

## 2021-01-03 MED ORDER — FLUOXETINE HCL 20 MG PO CAPS: 20.0000 mg | ORAL_CAPSULE | Freq: Every day | ORAL | 1 refills | Status: DC

## 2021-01-11 NOTE — Progress Notes (Signed)
Remote ICD transmission.   

## 2021-01-16 ENCOUNTER — Encounter (HOSPITAL_BASED_OUTPATIENT_CLINIC_OR_DEPARTMENT_OTHER): Payer: Self-pay | Admitting: Physical Therapy

## 2021-01-16 ENCOUNTER — Other Ambulatory Visit: Payer: Self-pay

## 2021-01-16 ENCOUNTER — Ambulatory Visit (HOSPITAL_BASED_OUTPATIENT_CLINIC_OR_DEPARTMENT_OTHER): Payer: BC Managed Care – PPO | Attending: Family Medicine | Admitting: Physical Therapy

## 2021-01-16 DIAGNOSIS — G8929 Other chronic pain: Secondary | ICD-10-CM | POA: Diagnosis not present

## 2021-01-16 DIAGNOSIS — M25512 Pain in left shoulder: Secondary | ICD-10-CM | POA: Insufficient documentation

## 2021-01-16 NOTE — Therapy (Signed)
[facility] [address] [place], [place], [address] Phone: [phone]   Fax:  [phone]  Physical Therapy Evaluation  Patient Details  Name: David Booker MRN: [id number] Date of Birth: [birth year] Referring Provider (PT): [name]   Encounter Date: 01/16/2021   PT End of Session - 01/16/21 1053     Visit Number 1    Number of Visits 13    Date for PT Re-Evaluation 03/02/21    Authorization Type BCBS    Authorization - Number of Visits 30    PT Start Time 1014    PT Stop Time 1053    PT Time Calculation (min) 39 min    Activity Tolerance Patient tolerated treatment well    Behavior During Therapy [facility] for tasks assessed/performed             Past Medical History:  Diagnosis Date   Abnormal heart rhythm    Allergic rhinitis, cause unspecified    Anxiety    Back pain    without radiation   Bradycardia    Noted on event monitor (04/20/12-05/20/12) Just before bradycardia was recorded, HR was approx. 130bpm   Depressive disorder, not elsewhere classified    Esophageal reflux    Chronic but his current pain appears to be different than his radiating to the back. Possible biliary disease. (04/22/13 Dr. Carman Ching, GI)   Genital HSV    Heartburn    Long Hx   Near syncope    While wearing event monitor (04/20/12-05/20/12) 46bpm. Also had chest tightness at this time. Possible vagal [name] 05/22/12)   Other specified circulatory system disorders    Palpitations    PVC (premature ventricular contraction)    Noted on event monitor (04/20/12-05/20/12)    Substernal chest pain 04/22/13   Pt reports having vague symptoms of substernal CP, different from heartburn in that it is dull & radiatesto the back. Occurring at least 3 episodes over past several weeks. Pain free in between episodes. (04/22/13 Dr. Carman Ching GI)    Syncope    Syncope and collapse    Tachycardia, unspecified    Viral syndrome    Wide QRS  ventricular tachycardia (HCC)    Noted on event monitor (04/20/12-05/20/12) at night    Past Surgical History:  Procedure Laterality Date   CARDIAC DEFIBRILLATOR PLACEMENT  11/04/13   SJM Fortify Assura DR implanted by Dr [name] for hypertrophic CM and syncope   IMPLANTABLE CARDIOVERTER DEFIBRILLATOR IMPLANT N/A 11/04/2013   Procedure: IMPLANTABLE CARDIOVERTER DEFIBRILLATOR IMPLANT;  Surgeon: Gardiner Rhyme, MD;  Location: [facility] CATH LAB;  Service: Cardiovascular;  Laterality: N/A;   SHOULDER SURGERY Right    Dislocated    There were no vitals filed for this visit.    Subjective Assessment - 01/16/21 1013     Subjective It is super unstable. I had surgery on my Rt shoulder years ago- dislocated it after sneezing. Lt doesn't pop out but moves around and gets super painful with just daily activities. Anything reaching out can hurt. Sleeping on Lt is uncomfortable. Can feel grinding wihtout notable popping. Random elbow pain that is seemingly unrelated- tennis elbow bilaterally. Denies neck involvement.    Patient Stated Goals driving, working, reaching    Currently in Pain? Yes    Pain Score 0-No pain    Pain Location Shoulder    Pain Orientation Left    Pain Descriptors / Indicators Sharp    Aggravating Factors  reaching    Pain Relieving  Factors being still/at rest                [facility] PT Assessment - 01/16/21 0001       Assessment   Medical Diagnosis left shoulder impingement    Referring Provider (PT) [name]    Hand Dominance Right    Prior Therapy not for left      Precautions   Precautions ICD/Pacemaker      Restrictions   Weight Bearing Restrictions No      Balance Screen   Has the patient fallen in the past 6 months No      Home Environment   Living Environment Private residence      Prior Function   Level of Independence Independent    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      Cognition   Overall Cognitive Status Within  Functional Limits for tasks assessed      Sensation   Additional Comments [facility]      Posture/Postural Control   Posture Comments anteriorly displaced humerus      ROM / Strength   AROM / PROM / Strength Strength;AROM      AROM   Overall AROM Comments Full AROM available      Strength   Overall Strength Comments gross 5/5 with notable instability at GHJ- elevation with abd most notable      Palpation   Palpation comment elevated [place] joint on Lt side      Special Tests   Other special tests Quick Dash 52.3, SPADI 51                        Objective measurements completed on examination: See above findings.       [facility] Adult PT Treatment/Exercise - 01/16/21 0001       Exercises   Exercises Shoulder      Shoulder Exercises: Seated   External Rotation Strengthening;Both    Theraband Level (Shoulder External Rotation) Level 2 (Red)    Other Seated Exercises resting postural alignment/activation      Shoulder Exercises: Isometric Strengthening   Other Isometric Exercises all directions towel on door frame- cues for posture                    PT Education - 01/16/21 1251     Education Details anatomy of condition, POC, HEP, exercise form/rationale    Person(s) Educated Patient    Methods Explanation;Demonstration;Tactile cues;Verbal cues;Handout    Comprehension Verbalized understanding;Returned demonstration;Verbal cues required;Tactile cues required;Need further instruction                 PT Long Term Goals - 01/16/21 1236       PT LONG TERM GOAL #1   Title quick Dash to improve by MDC (16 points)    Baseline 52.3 at eval    Time 6    Period Weeks    Status New    Target Date 03/02/21      PT LONG TERM GOAL #2   Title SPADI to change by MDC (21.5)    Baseline 51 at eval    Time 6    Period Weeks    Status New    Target Date 03/02/21      PT LONG TERM GOAL #3   Title pt will be able to drive wihtout limitation by  shoulder pain    Baseline notable at eval    Time 6  Period Weeks    Status New    Target Date 03/02/21      PT LONG TERM GOAL #4   Title able to demo stable GHJ in MMT    Baseline unstable with shifting notable at eval    Time 6    Period Weeks    Status New    Target Date 03/02/21                    Plan - 01/16/21 1047     Clinical Impression Statement Pt presents to PT wtih complaints of chronic Lt shoulder pain of insidious onset. Overall he has hypermobiility in his GHJ and required surgical intervention to the Right in the past for a similar issue. He does not have any demands for regular heavy lifting but does have pain with low level ADLs that require use of Lt UE. Pt will benefit from skilled PT to address hypermobility and target deep RC muscles for support for GHJ.    Personal Factors and Comorbidities Time since onset of injury/illness/exacerbation;Comorbidity 1    Comorbidities pacemaker implant    Examination-Activity Limitations Bathing;Sleep;Carry;Dressing;Lift;Reach Overhead    Examination-Participation Restrictions Cleaning;Driving;Occupation    Stability/Clinical Decision Making Stable/Uncomplicated    Clinical Decision Making Low    Rehab Potential Good    PT Frequency 2x / week    PT Duration 6 weeks    PT Treatment/Interventions ADLs/Self Care Home Management;Cryotherapy;Moist Heat;Functional mobility training;Therapeutic activities;Therapeutic exercise;Neuromuscular re-education;Manual techniques;Patient/family education;Passive range of motion;Dry needling;Taping    PT Next Visit Plan review isometrics, add CKC if tol.    PT Home Exercise Plan [id number]    Consulted and Agree with Plan of Care Patient             Patient will benefit from skilled therapeutic intervention in order to improve the following deficits and impairments:  Improper body mechanics, Postural dysfunction, Decreased activity tolerance, Hypermobility, Pain, Impaired UE  functional use  Visit Diagnosis: Chronic left shoulder pain - Plan: PT plan of care cert/re-cert     Problem List Patient Active Problem List   Diagnosis Date Noted   Shoulder impingement syndrome, left 10/17/2020   Soft tissue swelling 09/07/2020   Left shoulder pain 09/07/2020   Patellofemoral pain syndrome of left knee 09/07/2020   Dermatochalasis of upper and lower eyelids of both eyes 05/27/2017   Activity, other specified 07/06/2015   Biliary colic 09/26/2014   Gallbladder sludge 09/26/2014   Other specified diseases of gallbladder 09/26/2014   ICD (implantable cardioverter-defibrillator) in place 03/18/2014   Chest pain, unspecified 08/09/2013   GERD (gastroesophageal reflux disease) 08/09/2013   NSVT (nonsustained ventricular tachycardia) (HCC) 08/09/2013   Hypertrophic cardiomyopathy (HCC) 07/04/2013   Syncope 06/21/2012   Lavone Barrientes C. Inri Sobieski PT, DPT 01/16/21 12:58 PM   [facility] [address] [place], [place], [address] Phone: [phone]   Fax:  [phone]  Name: Tymel Conely MRN: [id number] Date of Birth: 1975-11-02

## 2021-01-23 ENCOUNTER — Ambulatory Visit (HOSPITAL_BASED_OUTPATIENT_CLINIC_OR_DEPARTMENT_OTHER): Payer: BC Managed Care – PPO | Admitting: Physical Therapy

## 2021-01-23 ENCOUNTER — Other Ambulatory Visit: Payer: Self-pay

## 2021-01-23 ENCOUNTER — Encounter (HOSPITAL_BASED_OUTPATIENT_CLINIC_OR_DEPARTMENT_OTHER): Payer: Self-pay | Admitting: Physical Therapy

## 2021-01-23 DIAGNOSIS — G8929 Other chronic pain: Secondary | ICD-10-CM | POA: Diagnosis not present

## 2021-01-23 DIAGNOSIS — M25512 Pain in left shoulder: Secondary | ICD-10-CM | POA: Diagnosis not present

## 2021-01-23 NOTE — Therapy (Signed)
[facility] [address] [place], [place], [address] Phone: [phone]   Fax:  [phone]  Physical Therapy Treatment  Patient Details  Name: David Booker MRN: [id number] Date of Birth: [birth year] Referring Provider (PT): [name]   Encounter Date: 01/23/2021   PT End of Session - 01/23/21 1019     Visit Number 2    Number of Visits 13    Date for PT Re-Evaluation 03/02/21    Authorization Type BCBS    Authorization - Number of Visits 30    PT Start Time 1015    PT Stop Time 1053    PT Time Calculation (min) 38 min    Activity Tolerance Patient tolerated treatment well    Behavior During Therapy [facility] for tasks assessed/performed             Past Medical History:  Diagnosis Date   Abnormal heart rhythm    Allergic rhinitis, cause unspecified    Anxiety    Back pain    without radiation   Bradycardia    Noted on event monitor (04/20/12-05/20/12) Just before bradycardia was recorded, HR was approx. 130bpm   Depressive disorder, not elsewhere classified    Esophageal reflux    Chronic but his current pain appears to be different than his radiating to the back. Possible biliary disease. (04/22/13 Dr. Carman Ching, GI)   Genital HSV    Heartburn    Long Hx   Near syncope    While wearing event monitor (04/20/12-05/20/12) 46bpm. Also had chest tightness at this time. Possible vagal [name] 05/22/12)   Other specified circulatory system disorders    Palpitations    PVC (premature ventricular contraction)    Noted on event monitor (04/20/12-05/20/12)    Substernal chest pain 04/22/13   Pt reports having vague symptoms of substernal CP, different from heartburn in that it is dull & radiatesto the back. Occurring at least 3 episodes over past several weeks. Pain free in between episodes. (04/22/13 Dr. Carman Ching GI)    Syncope    Syncope and collapse    Tachycardia, unspecified    Viral syndrome    Wide QRS  ventricular tachycardia (HCC)    Noted on event monitor (04/20/12-05/20/12) at night    Past Surgical History:  Procedure Laterality Date   CARDIAC DEFIBRILLATOR PLACEMENT  11/04/13   SJM Fortify Assura DR implanted by Dr [name] for hypertrophic CM and syncope   IMPLANTABLE CARDIOVERTER DEFIBRILLATOR IMPLANT N/A 11/04/2013   Procedure: IMPLANTABLE CARDIOVERTER DEFIBRILLATOR IMPLANT;  Surgeon: Gardiner Rhyme, MD;  Location: [facility] CATH LAB;  Service: Cardiovascular;  Laterality: N/A;   SHOULDER SURGERY Right    Dislocated    There were no vitals filed for this visit.   Subjective Assessment - 01/23/21 1018     Subjective Icing regularly, sorebut no pain. ER with band is hardest.    Patient Stated Goals driving, working, reaching    Currently in Pain? No/denies                               [facility] Adult PT Treatment/Exercise - 01/23/21 0001       Shoulder Exercises: Prone   Other Prone Exercises qped protraction- prog to alt lift- prog to alt flexion      Shoulder Exercises: Standing   Protraction Limitations in wall push ups- prog to push up plus    External Rotation Limitations ball press into  wall- slight flexion rolling ball fwd, focus on adding scapular control    Flexion Limitations ball on wall press with elbow to 90, press ball into wall and tiny circles                          PT Long Term Goals - 01/16/21 1236       PT LONG TERM GOAL #1   Title quick Dash to improve by MDC (16 points)    Baseline 52.3 at eval    Time 6    Period Weeks    Status New    Target Date 03/02/21      PT LONG TERM GOAL #2   Title SPADI to change by MDC (21.5)    Baseline 51 at eval    Time 6    Period Weeks    Status New    Target Date 03/02/21      PT LONG TERM GOAL #3   Title pt will be able to drive wihtout limitation by shoulder pain    Baseline notable at eval    Time 6    Period Weeks    Status New    Target Date 03/02/21      PT LONG  TERM GOAL #4   Title able to demo stable GHJ in MMT    Baseline unstable with shifting notable at eval    Time 6    Period Weeks    Status New    Target Date 03/02/21                   Plan - 01/23/21 1102     Clinical Impression Statement prog isometric motions by adding motion of ball on wall which was difficult but denied incr in concordant pain. tactile cuing to avoid scapular winging required. Notable incr in motion in Rt scapula and Rt cervical sidebend in motions requiring both UEs.    PT Treatment/Interventions ADLs/Self Care Home Management;Cryotherapy;Moist Heat;Functional mobility training;Therapeutic activities;Therapeutic exercise;Neuromuscular re-education;Manual techniques;Patient/family education;Passive range of motion;Dry needling;Taping    PT Next Visit Plan cont to progress CKC as tolerated to [facility]    PT Home Exercise Plan [id number]    Consulted and Agree with Plan of Care Patient             Patient will benefit from skilled therapeutic intervention in order to improve the following deficits and impairments:  Improper body mechanics, Postural dysfunction, Decreased activity tolerance, Hypermobility, Pain, Impaired UE functional use  Visit Diagnosis: Chronic left shoulder pain     Problem List Patient Active Problem List   Diagnosis Date Noted   Shoulder impingement syndrome, left 10/17/2020   Soft tissue swelling 09/07/2020   Left shoulder pain 09/07/2020   Patellofemoral pain syndrome of left knee 09/07/2020   Dermatochalasis of upper and lower eyelids of both eyes 05/27/2017   Activity, other specified 07/06/2015   Biliary colic 09/26/2014   Gallbladder sludge 09/26/2014   Other specified diseases of gallbladder 09/26/2014   ICD (implantable cardioverter-defibrillator) in place 03/18/2014   Chest pain, unspecified 08/09/2013   GERD (gastroesophageal reflux disease) 08/09/2013   NSVT (nonsustained ventricular tachycardia) (HCC) 08/09/2013    Hypertrophic cardiomyopathy (HCC) 07/04/2013   Syncope 06/21/2012   Felicia Both C. Camerin Ladouceur PT, DPT 01/23/21 11:05 AM   [facility] [address] [place], [place], [address] Phone: [phone]   Fax:  [phone]  Name: David Booker MRN: [id number] Date of Birth:  09/16/1975    

## 2021-01-26 ENCOUNTER — Ambulatory Visit (HOSPITAL_BASED_OUTPATIENT_CLINIC_OR_DEPARTMENT_OTHER): Payer: BC Managed Care – PPO | Admitting: Physical Therapy

## 2021-01-26 ENCOUNTER — Encounter (HOSPITAL_BASED_OUTPATIENT_CLINIC_OR_DEPARTMENT_OTHER): Payer: Self-pay | Admitting: Physical Therapy

## 2021-01-26 ENCOUNTER — Other Ambulatory Visit: Payer: Self-pay

## 2021-01-26 DIAGNOSIS — G8929 Other chronic pain: Secondary | ICD-10-CM

## 2021-01-26 DIAGNOSIS — M25512 Pain in left shoulder: Secondary | ICD-10-CM | POA: Diagnosis not present

## 2021-01-26 NOTE — Therapy (Signed)
[facility] [address] [place], [place], [address] Phone: [phone]   Fax:  [phone]  Physical Therapy Treatment  Patient Details  Name: David Booker MRN: [id number] Date of Birth: 1976-03-14 Referring Provider (PT): [name]   Encounter Date: 01/26/2021   PT End of Session - 01/26/21 1229     Visit Number 3    Number of Visits 13    Date for PT Re-Evaluation 03/02/21    Authorization Type BCBS    Authorization - Number of Visits 30    PT Start Time 1228    PT Stop Time 1309    PT Time Calculation (min) 41 min    Activity Tolerance Patient tolerated treatment well    Behavior During Therapy [facility] for tasks assessed/performed             Past Medical History:  Diagnosis Date   Abnormal heart rhythm    Allergic rhinitis, cause unspecified    Anxiety    Back pain    without radiation   Bradycardia    Noted on event monitor (04/20/12-05/20/12) Just before bradycardia was recorded, HR was approx. 130bpm   Depressive disorder, not elsewhere classified    Esophageal reflux    Chronic but his current pain appears to be different than his radiating to the back. Possible biliary disease. (04/22/13 Dr. Carman Ching, GI)   Genital HSV    Heartburn    Long Hx   Near syncope    While wearing event monitor (04/20/12-05/20/12) 46bpm. Also had chest tightness at this time. Possible vagal [name] 05/22/12)   Other specified circulatory system disorders    Palpitations    PVC (premature ventricular contraction)    Noted on event monitor (04/20/12-05/20/12)    Substernal chest pain 04/22/13   Pt reports having vague symptoms of substernal CP, different from heartburn in that it is dull & radiatesto the back. Occurring at least 3 episodes over past several weeks. Pain free in between episodes. (04/22/13 Dr. Carman Ching GI)    Syncope    Syncope and collapse    Tachycardia, unspecified    Viral syndrome    Wide QRS  ventricular tachycardia (HCC)    Noted on event monitor (04/20/12-05/20/12) at night    Past Surgical History:  Procedure Laterality Date   CARDIAC DEFIBRILLATOR PLACEMENT  11/04/13   SJM Fortify Assura DR implanted by Dr [name] for hypertrophic CM and syncope   IMPLANTABLE CARDIOVERTER DEFIBRILLATOR IMPLANT N/A 11/04/2013   Procedure: IMPLANTABLE CARDIOVERTER DEFIBRILLATOR IMPLANT;  Surgeon: Gardiner Rhyme, MD;  Location: [facility] CATH LAB;  Service: Cardiovascular;  Laterality: N/A;   SHOULDER SURGERY Right    Dislocated    There were no vitals filed for this visit.   Subjective Assessment - 01/26/21 1339     Subjective I think we may have overdone it, I had a "jello-arm" sensation for a couple of days.    Patient Stated Goals driving, working, reaching    Currently in Pain? No/denies                               [facility] Adult PT Treatment/Exercise - 01/26/21 0001       Shoulder Exercises: Supine   Protraction Limitations 1lb bar- limiting range    External Rotation Both;Theraband    Theraband Level (Shoulder External Rotation) Level 1 (Yellow)    External Rotation Limitations bilat and single with opp in isometric  Other Supine Exercises rythmic stabs      Shoulder Exercises: Standing   Flexion Limitations slide up wall with cues for depression    Other Standing Exercises ball circles on wall- 80 deg flx, small circles      Shoulder Exercises: Isometric Strengthening   Other Isometric Exercises all directions ball to wall                          PT Long Term Goals - 01/16/21 1236       PT LONG TERM GOAL #1   Title quick Dash to improve by MDC (16 points)    Baseline 52.3 at eval    Time 6    Period Weeks    Status New    Target Date 03/02/21      PT LONG TERM GOAL #2   Title SPADI to change by MDC (21.5)    Baseline 51 at eval    Time 6    Period Weeks    Status New    Target Date 03/02/21      PT LONG TERM GOAL #3    Title pt will be able to drive wihtout limitation by shoulder pain    Baseline notable at eval    Time 6    Period Weeks    Status New    Target Date 03/02/21      PT LONG TERM GOAL #4   Title able to demo stable GHJ in MMT    Baseline unstable with shifting notable at eval    Time 6    Period Weeks    Status New    Target Date 03/02/21                   Plan - 01/26/21 1338     Clinical Impression Statement Pt had a sensation of poor control due to fatigue from exercises at last visits so exercises were reduced in intensity. Placed Ktape for deltoid activation and GHJ approximation.    PT Treatment/Interventions ADLs/Self Care Home Management;Cryotherapy;Moist Heat;Functional mobility training;Therapeutic activities;Therapeutic exercise;Neuromuscular re-education;Manual techniques;Patient/family education;Passive range of motion;Dry needling;Taping    PT Next Visit Plan outcome of tape? cont to prog as tolerated    PT Home Exercise Plan [id number]    Consulted and Agree with Plan of Care Patient             Patient will benefit from skilled therapeutic intervention in order to improve the following deficits and impairments:  Improper body mechanics, Postural dysfunction, Decreased activity tolerance, Hypermobility, Pain, Impaired UE functional use  Visit Diagnosis: Chronic left shoulder pain     Problem List Patient Active Problem List   Diagnosis Date Noted   Shoulder impingement syndrome, left 10/17/2020   Soft tissue swelling 09/07/2020   Left shoulder pain 09/07/2020   Patellofemoral pain syndrome of left knee 09/07/2020   Dermatochalasis of upper and lower eyelids of both eyes 05/27/2017   Activity, other specified 07/06/2015   Biliary colic 09/26/2014   Gallbladder sludge 09/26/2014   Other specified diseases of gallbladder 09/26/2014   ICD (implantable cardioverter-defibrillator) in place 03/18/2014   Chest pain, unspecified 08/09/2013   GERD  (gastroesophageal reflux disease) 08/09/2013   NSVT (nonsustained ventricular tachycardia) (HCC) 08/09/2013   Hypertrophic cardiomyopathy (HCC) 07/04/2013   Syncope 06/21/2012   Alarik Radu C. Charnita Trudel PT, DPT 01/26/21 1:40 PM  [facility] [address] [place], [place], [address] Phone: [phone]  Fax:  [phone]  Name: David Booker MRN: [id number] Date of Birth: March 24, 1976

## 2021-01-29 ENCOUNTER — Other Ambulatory Visit: Payer: Self-pay

## 2021-01-29 ENCOUNTER — Encounter (HOSPITAL_BASED_OUTPATIENT_CLINIC_OR_DEPARTMENT_OTHER): Payer: Self-pay | Admitting: Physical Therapy

## 2021-01-29 ENCOUNTER — Ambulatory Visit (HOSPITAL_BASED_OUTPATIENT_CLINIC_OR_DEPARTMENT_OTHER): Payer: BC Managed Care – PPO | Admitting: Physical Therapy

## 2021-01-29 DIAGNOSIS — M25512 Pain in left shoulder: Secondary | ICD-10-CM | POA: Diagnosis not present

## 2021-01-29 DIAGNOSIS — G8929 Other chronic pain: Secondary | ICD-10-CM | POA: Diagnosis not present

## 2021-01-29 NOTE — Therapy (Signed)
[facility] [address] [place], [place], [address] Phone: [phone]   Fax:  [phone]  Physical Therapy Treatment  Patient Details  Name: David Booker MRN: [id number] Date of Birth: 23-Oct-1975 Referring Provider (PT): [name]   Encounter Date: 01/29/2021   PT End of Session - 01/29/21 1116     Visit Number 4    Number of Visits 13    Date for PT Re-Evaluation 03/02/21    Authorization Type BCBS    PT Start Time 1015    PT Stop Time 1058    PT Time Calculation (min) 43 min    Activity Tolerance Patient tolerated treatment well    Behavior During Therapy [facility] for tasks assessed/performed             Past Medical History:  Diagnosis Date   Abnormal heart rhythm    Allergic rhinitis, cause unspecified    Anxiety    Back pain    without radiation   Bradycardia    Noted on event monitor (04/20/12-05/20/12) Just before bradycardia was recorded, HR was approx. 130bpm   Depressive disorder, not elsewhere classified    Esophageal reflux    Chronic but his current pain appears to be different than his radiating to the back. Possible biliary disease. (04/22/13 Dr. Carman Ching, GI)   Genital HSV    Heartburn    Long Hx   Near syncope    While wearing event monitor (04/20/12-05/20/12) 46bpm. Also had chest tightness at this time. Possible vagal [name] 05/22/12)   Other specified circulatory system disorders    Palpitations    PVC (premature ventricular contraction)    Noted on event monitor (04/20/12-05/20/12)    Substernal chest pain 04/22/13   Pt reports having vague symptoms of substernal CP, different from heartburn in that it is dull & radiatesto the back. Occurring at least 3 episodes over past several weeks. Pain free in between episodes. (04/22/13 Dr. Carman Ching GI)    Syncope    Syncope and collapse    Tachycardia, unspecified    Viral syndrome    Wide QRS ventricular tachycardia (HCC)    Noted  on event monitor (04/20/12-05/20/12) at night    Past Surgical History:  Procedure Laterality Date   CARDIAC DEFIBRILLATOR PLACEMENT  11/04/13   SJM Fortify Assura DR implanted by Dr [name] for hypertrophic CM and syncope   IMPLANTABLE CARDIOVERTER DEFIBRILLATOR IMPLANT N/A 11/04/2013   Procedure: IMPLANTABLE CARDIOVERTER DEFIBRILLATOR IMPLANT;  Surgeon: Gardiner Rhyme, MD;  Location: [facility] CATH LAB;  Service: Cardiovascular;  Laterality: N/A;   SHOULDER SURGERY Right    Dislocated    There were no vitals filed for this visit.   Subjective Assessment - 01/29/21 1016     Subjective Tape is still on and I could feel the pull when I was doing something I sould not. It feels like it is more in place. felt fine after last appointment.    Patient Stated Goals driving, working, reaching    Currently in Pain? No/denies                [facility] PT Assessment - 01/29/21 0001       Posture/Postural Control   Posture Comments anterior Lt GHJ vs Rt in supine                           [facility] Adult PT Treatment/Exercise - 01/29/21 0001  Shoulder Exercises: Supine   External Rotation Both;Theraband    Theraband Level (Shoulder External Rotation) Level 1 (Yellow)    External Rotation Limitations AP pressure from PT on Lt GHJ    Theraband Level (Shoulder Flexion) Level 1 (Yellow)    Flexion Limitations holding ER on yellow band      Shoulder Exercises: Seated   Other Seated Exercises triceps extension yellow tband      Shoulder Exercises: Prone   Retraction Left;Strengthening    Retraction Limitations tactile cues- added slight lift of LT UE      Shoulder Exercises: Sidelying   External Rotation Left;Strengthening    Theraband Level (Shoulder External Rotation) Level 1 (Yellow)    Flexion Left;Strengthening;15 reps    Flexion Weight (lbs) 1    Flexion Limitations parallel to floor + protraction- retraction+row back    ABduction Limitations punch to ceiling from 80  deg ER 1 lb                          PT Long Term Goals - 01/16/21 1236       PT LONG TERM GOAL #1   Title quick Dash to improve by MDC (16 points)    Baseline 52.3 at eval    Time 6    Period Weeks    Status New    Target Date 03/02/21      PT LONG TERM GOAL #2   Title SPADI to change by MDC (21.5)    Baseline 51 at eval    Time 6    Period Weeks    Status New    Target Date 03/02/21      PT LONG TERM GOAL #3   Title pt will be able to drive wihtout limitation by shoulder pain    Baseline notable at eval    Time 6    Period Weeks    Status New    Target Date 03/02/21      PT LONG TERM GOAL #4   Title able to demo stable GHJ in MMT    Baseline unstable with shifting notable at eval    Time 6    Period Weeks    Status New    Target Date 03/02/21                   Plan - 01/29/21 1117     Clinical Impression Statement Pt tolerated exercises well today- utilized table in supine, sidelying & prone for stability to rib cage and focus on scapular control. Very few tactile cues required as he is able to respond with verbal cuing and feel the proper changes. Left tape as it seems to be helpful in cuing for form in ADLs.    PT Treatment/Interventions ADLs/Self Care Home Management;Cryotherapy;Moist Heat;Functional mobility training;Therapeutic activities;Therapeutic exercise;Neuromuscular re-education;Manual techniques;Patient/family education;Passive range of motion;Dry needling;Taping    PT Next Visit Plan retape PRN, progress prone if tolerated.    PT Home Exercise Plan [id number]    Consulted and Agree with Plan of Care Patient             Patient will benefit from skilled therapeutic intervention in order to improve the following deficits and impairments:  Improper body mechanics, Postural dysfunction, Decreased activity tolerance, Hypermobility, Pain, Impaired UE functional use  Visit Diagnosis: Chronic left shoulder pain     Problem  List Patient Active Problem List   Diagnosis Date Noted   Shoulder impingement syndrome, left 10/17/2020  Soft tissue swelling 09/07/2020   Left shoulder pain 09/07/2020   Patellofemoral pain syndrome of left knee 09/07/2020   Dermatochalasis of upper and lower eyelids of both eyes 05/27/2017   Activity, other specified 07/06/2015   Biliary colic 09/26/2014   Gallbladder sludge 09/26/2014   Other specified diseases of gallbladder 09/26/2014   ICD (implantable cardioverter-defibrillator) in place 03/18/2014   Chest pain, unspecified 08/09/2013   GERD (gastroesophageal reflux disease) 08/09/2013   NSVT (nonsustained ventricular tachycardia) (HCC) 08/09/2013   Hypertrophic cardiomyopathy (HCC) 07/04/2013   Syncope 06/21/2012   Jordin Vicencio C. Lokelani Lutes PT, DPT 01/29/21 11:20 AM  [facility] [address] [place], [place], [address] Phone: [phone]   Fax:  [phone]  Name: David Booker MRN: [id number] Date of Birth: 1975-09-24

## 2021-02-01 ENCOUNTER — Other Ambulatory Visit: Payer: Self-pay

## 2021-02-01 ENCOUNTER — Encounter (HOSPITAL_BASED_OUTPATIENT_CLINIC_OR_DEPARTMENT_OTHER): Payer: Self-pay | Admitting: Physical Therapy

## 2021-02-01 ENCOUNTER — Ambulatory Visit (HOSPITAL_BASED_OUTPATIENT_CLINIC_OR_DEPARTMENT_OTHER): Payer: BC Managed Care – PPO | Admitting: Physical Therapy

## 2021-02-01 DIAGNOSIS — M25512 Pain in left shoulder: Secondary | ICD-10-CM

## 2021-02-01 DIAGNOSIS — G8929 Other chronic pain: Secondary | ICD-10-CM | POA: Diagnosis not present

## 2021-02-01 NOTE — Therapy (Signed)
[facility] [address] [place], [place], [address] Phone: [phone]   Fax:  [phone]  Physical Therapy Treatment  Patient Details  Name: David Booker MRN: [id number] Date of Birth: 01/24/1976 Referring Provider (PT): [name]   Encounter Date: 02/01/2021   PT End of Session - 02/01/21 1018     Visit Number 5    Number of Visits 13    Date for PT Re-Evaluation 03/02/21    Authorization Type BCBS    Authorization - Number of Visits 30    PT Start Time 1017    PT Stop Time 1056    PT Time Calculation (min) 39 min    Activity Tolerance Patient tolerated treatment well    Behavior During Therapy [facility] for tasks assessed/performed             Past Medical History:  Diagnosis Date   Abnormal heart rhythm    Allergic rhinitis, cause unspecified    Anxiety    Back pain    without radiation   Bradycardia    Noted on event monitor (04/20/12-05/20/12) Just before bradycardia was recorded, HR was approx. 130bpm   Depressive disorder, not elsewhere classified    Esophageal reflux    Chronic but his current pain appears to be different than his radiating to the back. Possible biliary disease. (04/22/13 Dr. Carman Ching, GI)   Genital HSV    Heartburn    Long Hx   Near syncope    While wearing event monitor (04/20/12-05/20/12) 46bpm. Also had chest tightness at this time. Possible vagal [name] 05/22/12)   Other specified circulatory system disorders    Palpitations    PVC (premature ventricular contraction)    Noted on event monitor (04/20/12-05/20/12)    Substernal chest pain 04/22/13   Pt reports having vague symptoms of substernal CP, different from heartburn in that it is dull & radiatesto the back. Occurring at least 3 episodes over past several weeks. Pain free in between episodes. (04/22/13 Dr. Carman Ching GI)    Syncope    Syncope and collapse    Tachycardia, unspecified    Viral syndrome    Wide QRS  ventricular tachycardia (HCC)    Noted on event monitor (04/20/12-05/20/12) at night    Past Surgical History:  Procedure Laterality Date   CARDIAC DEFIBRILLATOR PLACEMENT  11/04/13   SJM Fortify Assura DR implanted by Dr [name] for hypertrophic CM and syncope   IMPLANTABLE CARDIOVERTER DEFIBRILLATOR IMPLANT N/A 11/04/2013   Procedure: IMPLANTABLE CARDIOVERTER DEFIBRILLATOR IMPLANT;  Surgeon: Gardiner Rhyme, MD;  Location: [facility] CATH LAB;  Service: Cardiovascular;  Laterality: N/A;   SHOULDER SURGERY Right    Dislocated    There were no vitals filed for this visit.   Subjective Assessment - 02/01/21 1018     Subjective No pain after last visit. did get a posture support brace and it feels pretty good.    Patient Stated Goals driving, working, reaching    Currently in Pain? No/denies                               [facility] Adult PT Treatment/Exercise - 02/01/21 0001       Shoulder Exercises: Seated   Other Seated Exercises W pull yellow tband      Shoulder Exercises: Prone   Retraction Both    Retraction Limitations 5s holds    Flexion Limitations I & Y from lowered arm  rests in table    Extension Limitations retraction +small range extension 5s holds    Other Prone Exercises prone W      Shoulder Exercises: Standing   Flexion Limitations I, Y liftoff of wall yellow tband on tension                          PT Long Term Goals - 01/16/21 1236       PT LONG TERM GOAL #1   Title quick Dash to improve by MDC (16 points)    Baseline 52.3 at eval    Time 6    Period Weeks    Status New    Target Date 03/02/21      PT LONG TERM GOAL #2   Title SPADI to change by MDC (21.5)    Baseline 51 at eval    Time 6    Period Weeks    Status New    Target Date 03/02/21      PT LONG TERM GOAL #3   Title pt will be able to drive wihtout limitation by shoulder pain    Baseline notable at eval    Time 6    Period Weeks    Status New    Target  Date 03/02/21      PT LONG TERM GOAL #4   Title able to demo stable GHJ in MMT    Baseline unstable with shifting notable at eval    Time 6    Period Weeks    Status New    Target Date 03/02/21                   Plan - 02/01/21 1057     Clinical Impression Statement Worked in prone today with focus on scapular control. Some cuing required but pt is able to feel poor motion and correct it. As long as he feels okay with HEP until next apt we will revisit CKC and begin adding larger ranges of motion.    PT Treatment/Interventions ADLs/Self Care Home Management;Cryotherapy;Moist Heat;Functional mobility training;Therapeutic activities;Therapeutic exercise;Neuromuscular re-education;Manual techniques;Patient/family education;Passive range of motion;Dry needling;Taping    PT Next Visit Plan if tolerated: add OH press from seated W pull, revisit qped    PT Home Exercise Plan [id number]    Consulted and Agree with Plan of Care Patient             Patient will benefit from skilled therapeutic intervention in order to improve the following deficits and impairments:  Improper body mechanics, Postural dysfunction, Decreased activity tolerance, Hypermobility, Pain, Impaired UE functional use  Visit Diagnosis: Chronic left shoulder pain     Problem List Patient Active Problem List   Diagnosis Date Noted   Shoulder impingement syndrome, left 10/17/2020   Soft tissue swelling 09/07/2020   Left shoulder pain 09/07/2020   Patellofemoral pain syndrome of left knee 09/07/2020   Dermatochalasis of upper and lower eyelids of both eyes 05/27/2017   Activity, other specified 07/06/2015   Biliary colic 09/26/2014   Gallbladder sludge 09/26/2014   Other specified diseases of gallbladder 09/26/2014   ICD (implantable cardioverter-defibrillator) in place 03/18/2014   Chest pain, unspecified 08/09/2013   GERD (gastroesophageal reflux disease) 08/09/2013   NSVT (nonsustained ventricular  tachycardia) (HCC) 08/09/2013   Hypertrophic cardiomyopathy (HCC) 07/04/2013   Syncope 06/21/2012   Lanaiya Lantry C. Jamisyn Langer PT, DPT 02/01/21 11:00 AM    [facility] [address] [place],  [place], [address] Phone: [phone]   Fax:  [phone]  Name: David Booker MRN: [id number] Date of Birth: 02-Dec-1975

## 2021-02-05 ENCOUNTER — Other Ambulatory Visit: Payer: Self-pay

## 2021-02-05 ENCOUNTER — Encounter (HOSPITAL_BASED_OUTPATIENT_CLINIC_OR_DEPARTMENT_OTHER): Payer: Self-pay | Admitting: Family Medicine

## 2021-02-05 ENCOUNTER — Ambulatory Visit (HOSPITAL_BASED_OUTPATIENT_CLINIC_OR_DEPARTMENT_OTHER): Payer: BC Managed Care – PPO | Admitting: Family Medicine

## 2021-02-05 VITALS — BP 100/70 | HR 60 | Ht 72.0 in | Wt 192.0 lb

## 2021-02-05 DIAGNOSIS — M7542 Impingement syndrome of left shoulder: Secondary | ICD-10-CM

## 2021-02-05 NOTE — Progress Notes (Signed)
    Procedures performed today:    None.  Independent interpretation of notes and tests performed by another provider:   None.  Brief History, Exam, Impression, and Recommendations:    BP 100/70   Pulse 60   Ht 6' (1.829 m)   Wt 192 lb (87.1 kg)   SpO2 99%   BMI 26.04 kg/m   Shoulder impingement syndrome, left Continues to have some pain present, however feels that he has had some improvement since initiating PT Has also been doing home exercise program Quality of pain is similar as before PT, no new symptoms reported Discussed options with patient Will continue with physical therapy, home exercise program Can consider subacromial injection to help with short to medium term pain relief Also consideration would be advanced imaging if symptoms not improving as expected Plan for follow-up in about 2 to 3 months or sooner as needed   ___________________________________________ Corinn Stoltzfus [name], MD, ABFM, CAQSM Primary Care and Sports Medicine [facility]

## 2021-02-05 NOTE — Assessment & Plan Note (Addendum)
Continues to have some pain present, however feels that he has had some improvement since initiating PT Has also been doing home exercise program Quality of pain is similar as before PT, no new symptoms reported Discussed options with patient Will continue with physical therapy, home exercise program Can consider subacromial injection to help with short to medium term pain relief Also consideration would be advanced imaging if symptoms not improving as expected Plan for follow-up in about 2 to 3 months or sooner as needed

## 2021-02-05 NOTE — Patient Instructions (Signed)
°  Medication Instructions:  °Your physician recommends that you continue on your current medications as directed. Please refer to the Current Medication list given to you today. °--If you need a refill on any your medications before your next appointment, please call your pharmacy first. If no refills are authorized on file call the office.-- ° °Follow-Up: °Your next appointment:   °Your physician recommends that you schedule a follow-up appointment in: 2-3 MONTHS with Dr. de [place] ° °You will receive a text message or e-mail with a link to a survey about your care and experience with us today! We would greatly appreciate your feedback!  ° °Thanks for letting us be apart of your health journey!!  °Primary Care and Sports Medicine  ° °Dr. Raymond de [place]  ° °We encourage you to activate your patient portal called "MyChart".  Sign up information is provided on this After Visit Summary.  MyChart is used to connect with patients for Virtual Visits (Telemedicine).  Patients are able to view lab/test results, encounter notes, upcoming appointments, etc.  Non-urgent messages can be sent to your provider as well. To learn more about what you can do with MyChart, please visit --  [email or web address]   ° °

## 2021-02-06 ENCOUNTER — Ambulatory Visit (HOSPITAL_BASED_OUTPATIENT_CLINIC_OR_DEPARTMENT_OTHER): Payer: BC Managed Care – PPO | Admitting: Physical Therapy

## 2021-02-06 ENCOUNTER — Encounter (HOSPITAL_BASED_OUTPATIENT_CLINIC_OR_DEPARTMENT_OTHER): Payer: Self-pay | Admitting: Physical Therapy

## 2021-02-06 DIAGNOSIS — G8929 Other chronic pain: Secondary | ICD-10-CM | POA: Diagnosis not present

## 2021-02-06 DIAGNOSIS — M25512 Pain in left shoulder: Secondary | ICD-10-CM

## 2021-02-06 NOTE — Therapy (Signed)
[facility] [address] [place], [place], [address] Phone: [phone]   Fax:  [phone]  Physical Therapy Treatment  Patient Details  Name: David Booker MRN: [id number] Date of Birth: 08-Mar-1976 Referring Provider (PT): [name]   Encounter Date: 02/06/2021   PT End of Session - 02/06/21 1023     Visit Number 6    Number of Visits 13    Date for PT Re-Evaluation 03/02/21    Authorization Type BCBS    Authorization - Number of Visits 30    PT Start Time 1023    PT Stop Time 1056    PT Time Calculation (min) 33 min    Activity Tolerance Patient tolerated treatment well    Behavior During Therapy [facility] for tasks assessed/performed             Past Medical History:  Diagnosis Date   Abnormal heart rhythm    Allergic rhinitis, cause unspecified    Anxiety    Back pain    without radiation   Bradycardia    Noted on event monitor (04/20/12-05/20/12) Just before bradycardia was recorded, HR was approx. 130bpm   Depressive disorder, not elsewhere classified    Esophageal reflux    Chronic but his current pain appears to be different than his radiating to the back. Possible biliary disease. (04/22/13 Dr. Carman Ching, GI)   Genital HSV    Heartburn    Long Hx   Near syncope    While wearing event monitor (04/20/12-05/20/12) 46bpm. Also had chest tightness at this time. Possible vagal [name] 05/22/12)   Other specified circulatory system disorders    Palpitations    PVC (premature ventricular contraction)    Noted on event monitor (04/20/12-05/20/12)    Substernal chest pain 04/22/13   Pt reports having vague symptoms of substernal CP, different from heartburn in that it is dull & radiatesto the back. Occurring at least 3 episodes over past several weeks. Pain free in between episodes. (04/22/13 Dr. Carman Ching GI)    Syncope    Syncope and collapse    Tachycardia, unspecified    Viral syndrome    Wide QRS  ventricular tachycardia (HCC)    Noted on event monitor (04/20/12-05/20/12) at night    Past Surgical History:  Procedure Laterality Date   CARDIAC DEFIBRILLATOR PLACEMENT  11/04/13   SJM Fortify Assura DR implanted by Dr [name] for hypertrophic CM and syncope   IMPLANTABLE CARDIOVERTER DEFIBRILLATOR IMPLANT N/A 11/04/2013   Procedure: IMPLANTABLE CARDIOVERTER DEFIBRILLATOR IMPLANT;  Surgeon: Gardiner Rhyme, MD;  Location: [facility] CATH LAB;  Service: Cardiovascular;  Laterality: N/A;   SHOULDER SURGERY Right    Dislocated    There were no vitals filed for this visit.   Subjective Assessment - 02/06/21 1024     Subjective Last time I was more sore, like thta was the max level it could do. I finally took the tape off and I am really feeling the instability and clicking. I didnt really do the exercises because I was sore but then I went mountain biking and was super sore.    Patient Stated Goals driving, working, reaching    Currently in Pain? No/denies                [facility] PT Assessment - 02/06/21 0001       Palpation   Palpation comment Gordon joint not elevated  [facility] Adult PT Treatment/Exercise - 02/06/21 0001       Shoulder Exercises: Seated   Other Seated Exercises rythmic stabs- long axis flexion & abd to 90 3x30s ea      Shoulder Exercises: Prone   Retraction Both    Retraction Limitations cues for arms to stay on table to isolate retraction    Extension Both;20 reps;Strengthening    Extension Limitations retraction+ext- pronated & supinated    Other Prone Exercises prone W      Manual Therapy   Manual Therapy Taping    Kinesiotex Facilitate Muscle      Kinesiotix   Facilitate Muscle  Lt GHJ- ant&mid deltoid, post deltoid to line of supraspinatus, perpendicular to deltoid wrap                          PT Long Term Goals - 01/16/21 1236       PT LONG TERM GOAL #1   Title quick Dash to improve by MDC (16  points)    Baseline 52.3 at eval    Time 6    Period Weeks    Status New    Target Date 03/02/21      PT LONG TERM GOAL #2   Title SPADI to change by MDC (21.5)    Baseline 51 at eval    Time 6    Period Weeks    Status New    Target Date 03/02/21      PT LONG TERM GOAL #3   Title pt will be able to drive wihtout limitation by shoulder pain    Baseline notable at eval    Time 6    Period Weeks    Status New    Target Date 03/02/21      PT LONG TERM GOAL #4   Title able to demo stable GHJ in MMT    Baseline unstable with shifting notable at eval    Time 6    Period Weeks    Status New    Target Date 03/02/21                   Plan - 02/06/21 1156     Clinical Impression Statement continued prone exercises today with some cuing but pt able to correct with VC. felt that post tape line going along line of supraspinatus was helpful in retraction.    PT Treatment/Interventions ADLs/Self Care Home Management;Cryotherapy;Moist Heat;Functional mobility training;Therapeutic activities;Therapeutic exercise;Neuromuscular re-education;Manual techniques;Patient/family education;Passive range of motion;Dry needling;Taping    PT Next Visit Plan if tolerated: add OH press from seated W pull, revisit qped    PT Home Exercise Plan [id number]    Consulted and Agree with Plan of Care Patient             Patient will benefit from skilled therapeutic intervention in order to improve the following deficits and impairments:  Improper body mechanics, Postural dysfunction, Decreased activity tolerance, Hypermobility, Pain, Impaired UE functional use  Visit Diagnosis: Chronic left shoulder pain     Problem List Patient Active Problem List   Diagnosis Date Noted   Shoulder impingement syndrome, left 10/17/2020   Soft tissue swelling 09/07/2020   Left shoulder pain 09/07/2020   Patellofemoral pain syndrome of left knee 09/07/2020   Dermatochalasis of upper and lower eyelids of  both eyes 05/27/2017   Activity, other specified 07/06/2015   Biliary colic 09/26/2014   Gallbladder sludge 09/26/2014   Other specified diseases  of gallbladder 09/26/2014   ICD (implantable cardioverter-defibrillator) in place 03/18/2014   Chest pain, unspecified 08/09/2013   GERD (gastroesophageal reflux disease) 08/09/2013   NSVT (nonsustained ventricular tachycardia) (HCC) 08/09/2013   Hypertrophic cardiomyopathy (HCC) 07/04/2013   Syncope 06/21/2012   Antjuan Rothe C. Mylena Sedberry PT, DPT 02/06/21 12:01 PM   [facility] [address] [place], [place], [address] Phone: [phone]   Fax:  [phone]  Name: David Booker MRN: [id number] Date of Birth: 1975/05/27

## 2021-02-08 ENCOUNTER — Ambulatory Visit (HOSPITAL_BASED_OUTPATIENT_CLINIC_OR_DEPARTMENT_OTHER): Payer: BC Managed Care – PPO | Admitting: Physical Therapy

## 2021-02-08 ENCOUNTER — Other Ambulatory Visit: Payer: Self-pay

## 2021-02-08 ENCOUNTER — Encounter (HOSPITAL_BASED_OUTPATIENT_CLINIC_OR_DEPARTMENT_OTHER): Payer: Self-pay | Admitting: Physical Therapy

## 2021-02-08 DIAGNOSIS — M25512 Pain in left shoulder: Secondary | ICD-10-CM | POA: Diagnosis not present

## 2021-02-08 DIAGNOSIS — G8929 Other chronic pain: Secondary | ICD-10-CM

## 2021-02-08 NOTE — Therapy (Signed)
**Note David-Identified via Obfuscation** [facility] [address] [place], [place], [address] Phone: [phone]   Fax:  [phone]  Physical Therapy Treatment  Patient Details  Name: David Booker MRN: [id number] Date of Birth: 11-19-1975 Referring Provider (PT): David Booker   Encounter Date: 02/08/2021   PT End of Session - 02/08/21 1021     Visit Number 7    Number of Visits 13    Date for PT Re-Evaluation 03/02/21    Authorization Type BCBS    Authorization - Number of Visits 30    PT Start Time 1018    PT Stop Time 1058    PT Time Calculation (min) 40 min    Activity Tolerance Patient tolerated treatment well    Behavior During Therapy [facility] for tasks assessed/performed             Past Medical History:  Diagnosis Date   Abnormal heart rhythm    Allergic rhinitis, cause unspecified    Anxiety    Back pain    without radiation   Bradycardia    Noted on event monitor (04/20/12-05/20/12) Just before bradycardia was recorded, HR was approx. 130bpm   Depressive disorder, not elsewhere classified    Esophageal reflux    Chronic but his current pain appears to be different than his radiating to the back. Possible biliary disease. (04/22/13 Dr. Carman Booker, GI)   Genital HSV    Heartburn    Long Hx   Near syncope    While wearing event monitor (04/20/12-05/20/12) 46bpm. Also had chest tightness at this time. Possible vagal David Booker 05/22/12)   Other specified circulatory system disorders    Palpitations    PVC (premature ventricular contraction)    Noted on event monitor (04/20/12-05/20/12)    Substernal chest pain 04/22/13   Pt reports having vague symptoms of substernal CP, different from heartburn in that it is dull & radiatesto the back. Occurring at least 3 episodes over past several weeks. Pain free in between episodes. (04/22/13 Dr. Carman Booker GI)    Syncope    Syncope and collapse    Tachycardia, unspecified    Viral syndrome    Wide QRS  ventricular tachycardia (HCC)    Noted on event monitor (04/20/12-05/20/12) at night    Past Surgical History:  Procedure Laterality Date   CARDIAC DEFIBRILLATOR PLACEMENT  11/04/13   SJM Fortify Assura DR implanted by Dr David Booker for hypertrophic CM and syncope   IMPLANTABLE CARDIOVERTER DEFIBRILLATOR IMPLANT N/A 11/04/2013   Procedure: IMPLANTABLE CARDIOVERTER DEFIBRILLATOR IMPLANT;  Surgeon: David Rhyme, MD;  Location: [facility] CATH LAB;  Service: Cardiovascular;  Laterality: N/A;   SHOULDER SURGERY Right    Dislocated    There were no vitals filed for this visit.   Subjective Assessment - 02/08/21 1023     Subjective Shoulder is doing okay.    Patient Stated Goals driving, working, reaching    Currently in Pain? No/denies                [facility] PT Assessment - 02/08/21 0001       Strength   Overall Strength Comments gross 5/5, shoulder elevation on Lt in ER testing                           [facility] Adult PT Treatment/Exercise - 02/08/21 0001       Shoulder Exercises: Seated   Other Seated Exercises W AROM wiht yellow weight ball in Lt  hand- prog to add abd to 90 from W with scap control      Shoulder Exercises: Prone   Other Prone Exercises qped: protract/retract, shift lat & fwd/back & circles; circles on yellow med ball                          PT Long Term Goals - 01/16/21 1236       PT LONG TERM GOAL #1   Title quick Dash to improve by MDC (16 points)    Baseline 52.3 at eval    Time 6    Period Weeks    Status New    Target Date 03/02/21      PT LONG TERM GOAL #2   Title SPADI to change by MDC (21.5)    Baseline 51 at eval    Time 6    Period Weeks    Status New    Target Date 03/02/21      PT LONG TERM GOAL #3   Title pt will be able to drive wihtout limitation by shoulder pain    Baseline notable at eval    Time 6    Period Weeks    Status New    Target Date 03/02/21      PT LONG TERM GOAL #4   Title able to  demo stable GHJ in MMT    Baseline unstable with shifting notable at eval    Time 6    Period Weeks    Status New    Target Date 03/02/21                   Plan - 02/08/21 1056     Clinical Impression Statement Tactile cuing required for re-education of periscapular activation. Added trunk post lean for incr abdominal engagement and decrease lat dorsi dominance. Significant increase in difficulty with addition of yellow theraband medicine ball. No pain in qped and improving tolerance to position.    PT Treatment/Interventions ADLs/Self Care Home Management;Cryotherapy;Moist Heat;Functional mobility training;Therapeutic activities;Therapeutic exercise;Neuromuscular re-education;Manual techniques;Patient/family education;Passive range of motion;Dry needling;Taping    PT Next Visit Plan add primal push up, cont with OH reach    PT Home Exercise Plan [id number]    Consulted and Agree with Plan of Care Patient             Patient will benefit from skilled therapeutic intervention in order to improve the following deficits and impairments:  Improper body mechanics, Postural dysfunction, Decreased activity tolerance, Hypermobility, Pain, Impaired UE functional use  Visit Diagnosis: Chronic left shoulder pain     Problem List Patient Active Problem List   Diagnosis Date Noted   Shoulder impingement syndrome, left 10/17/2020   Soft tissue swelling 09/07/2020   Left shoulder pain 09/07/2020   Patellofemoral pain syndrome of left knee 09/07/2020   Dermatochalasis of upper and lower eyelids of both eyes 05/27/2017   Activity, other specified 07/06/2015   Biliary colic 09/26/2014   Gallbladder sludge 09/26/2014   Other specified diseases of gallbladder 09/26/2014   ICD (implantable cardioverter-defibrillator) in place 03/18/2014   Chest pain, unspecified 08/09/2013   GERD (gastroesophageal reflux disease) 08/09/2013   NSVT (nonsustained ventricular tachycardia) (HCC)  08/09/2013   Hypertrophic cardiomyopathy (HCC) 07/04/2013   Syncope 06/21/2012   David Booker PT, DPT 02/08/21 12:49 PM    [facility] [address] [place], [place], [address] Phone: [phone]   Fax:  [phone]  Name:  David Booker MRN: [id number] Date of Birth: 14-Mar-1976

## 2021-02-12 ENCOUNTER — Encounter (HOSPITAL_BASED_OUTPATIENT_CLINIC_OR_DEPARTMENT_OTHER): Payer: Self-pay | Admitting: Physical Therapy

## 2021-02-12 ENCOUNTER — Other Ambulatory Visit: Payer: Self-pay

## 2021-02-12 ENCOUNTER — Ambulatory Visit (HOSPITAL_BASED_OUTPATIENT_CLINIC_OR_DEPARTMENT_OTHER): Payer: BC Managed Care – PPO | Attending: Family Medicine | Admitting: Physical Therapy

## 2021-02-12 DIAGNOSIS — M25512 Pain in left shoulder: Secondary | ICD-10-CM | POA: Insufficient documentation

## 2021-02-12 DIAGNOSIS — G8929 Other chronic pain: Secondary | ICD-10-CM | POA: Insufficient documentation
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
## 2021-02-15 ENCOUNTER — Ambulatory Visit (HOSPITAL_BASED_OUTPATIENT_CLINIC_OR_DEPARTMENT_OTHER): Payer: BC Managed Care – PPO | Admitting: Physical Therapy

## 2021-02-20 ENCOUNTER — Other Ambulatory Visit: Payer: Self-pay

## 2021-02-20 ENCOUNTER — Encounter (HOSPITAL_BASED_OUTPATIENT_CLINIC_OR_DEPARTMENT_OTHER): Payer: Self-pay | Admitting: Physical Therapy

## 2021-02-20 ENCOUNTER — Ambulatory Visit (HOSPITAL_BASED_OUTPATIENT_CLINIC_OR_DEPARTMENT_OTHER): Payer: BC Managed Care – PPO | Admitting: Physical Therapy

## 2021-02-20 DIAGNOSIS — M25512 Pain in left shoulder: Secondary | ICD-10-CM | POA: Diagnosis not present

## 2021-02-20 DIAGNOSIS — G8929 Other chronic pain: Secondary | ICD-10-CM

## 2021-02-20 NOTE — Therapy (Signed)
[facility] [address] [place], [place], [address] Phone: [phone]   Fax:  [phone]  Physical Therapy Treatment  Patient Details  Name: David Booker MRN: [id number] Date of Birth: 11-21-1975 Referring Provider (PT): [name]   Encounter Date: 02/20/2021   PT End of Session - 02/20/21 1019     Visit Number 9    Number of Visits 13    Date for PT Re-Evaluation 03/02/21    Authorization Type BCBS    PT Start Time 1017    PT Stop Time 1055    PT Time Calculation (min) 38 min    Activity Tolerance Patient tolerated treatment well    Behavior During Therapy [facility] for tasks assessed/performed             Past Medical History:  Diagnosis Date   Abnormal heart rhythm    Allergic rhinitis, cause unspecified    Anxiety    Back pain    without radiation   Bradycardia    Noted on event monitor (04/20/12-05/20/12) Just before bradycardia was recorded, HR was approx. 130bpm   Depressive disorder, not elsewhere classified    Esophageal reflux    Chronic but his current pain appears to be different than his radiating to the back. Possible biliary disease. (04/22/13 Dr. Carman Ching, GI)   Genital HSV    Heartburn    Long Hx   Near syncope    While wearing event monitor (04/20/12-05/20/12) 46bpm. Also had chest tightness at this time. Possible vagal [name] 05/22/12)   Other specified circulatory system disorders    Palpitations    PVC (premature ventricular contraction)    Noted on event monitor (04/20/12-05/20/12)    Substernal chest pain 04/22/13   Pt reports having vague symptoms of substernal CP, different from heartburn in that it is dull & radiatesto the back. Occurring at least 3 episodes over past several weeks. Pain free in between episodes. (04/22/13 Dr. Carman Ching GI)    Syncope    Syncope and collapse    Tachycardia, unspecified    Viral syndrome    Wide QRS ventricular tachycardia    Noted on  event monitor (04/20/12-05/20/12) at night    Past Surgical History:  Procedure Laterality Date   CARDIAC DEFIBRILLATOR PLACEMENT  11/04/13   SJM Fortify Assura DR implanted by Dr [name] for hypertrophic CM and syncope   IMPLANTABLE CARDIOVERTER DEFIBRILLATOR IMPLANT N/A 11/04/2013   Procedure: IMPLANTABLE CARDIOVERTER DEFIBRILLATOR IMPLANT;  Surgeon: Gardiner Rhyme, MD;  [facility] CATH LAB;  Service: Cardiovascular;  Laterality: N/A;   SHOULDER SURGERY Right    Dislocated    There were no vitals filed for this visit.   Subjective Assessment - 02/20/21 1145     Subjective I threw out my back- I think from changing a door lock and bending over repetitively, I am about 85%. Shoulder is doing OK. Tape and postural support brace are helpful still.    Patient Stated Goals driving, working, reaching    Currently in Pain? No/denies                [facility] PT Assessment - 02/20/21 0001       AROM   Overall AROM Comments lacking scapular glide on Lt vs Rt in forward motion.                           [facility] Adult PT Treatment/Exercise - 02/20/21 0001  Shoulder Exercises: Supine   Other Supine Exercises bed elevated to 45 deg: flexion to 90, protraction- added 1lb, bil UE circles at 90 flx, UE long arc scissors, upper body curl up/reach fwd shoulders at 90 flx      Shoulder Exercises: ROM/Strengthening   UBE (Upper Arm Bike) retro with tactile cues for Lt scapular motion      Shoulder Exercises: Stretch   Other Shoulder Stretches upper trap & levator      Manual Therapy   Manual Therapy Soft tissue mobilization    Soft tissue mobilization scpaular distraction with STM to subscap, levator scap                          PT Long Term Goals - 01/16/21 1236       PT LONG TERM GOAL #1   Title quick Dash to improve by MDC (16 points)    Baseline 52.3 at eval    Time 6    Period Weeks    Status New    Target Date 03/02/21      PT LONG  TERM GOAL #2   Title SPADI to change by MDC (21.5)    Baseline 51 at eval    Time 6    Period Weeks    Status New    Target Date 03/02/21      PT LONG TERM GOAL #3   Title pt will be able to drive wihtout limitation by shoulder pain    Baseline notable at eval    Time 6    Period Weeks    Status New    Target Date 03/02/21      PT LONG TERM GOAL #4   Title able to demo stable GHJ in MMT    Baseline unstable with shifting notable at eval    Time 6    Period Weeks    Status New    Target Date 03/02/21                   Plan - 02/20/21 1143     Clinical Impression Statement Elevated head of bed to 45 deg to challenge OH lifting with incr gravity resistance. Felt fatigue with reps as expected. 1lb at end of level arm for incr focus on motion. notable snapping of subscap with demand of increased motion and addressed with manual therapy. Denied incr in LBP with exercises today and encouraged core activation. Planning to extend another 4 weeks and will perform re-eval/re-cert next week.    PT Treatment/Interventions ADLs/Self Care Home Management;Cryotherapy;Moist Heat;Functional mobility training;Therapeutic activities;Therapeutic exercise;Neuromuscular re-education;Manual techniques;Patient/family education;Passive range of motion;Dry needling;Taping    PT Next Visit Plan upright OH reach as tol    PT Home Exercise Plan [id number]    Consulted and Agree with Plan of Care Patient             Patient will benefit from skilled therapeutic intervention in order to improve the following deficits and impairments:  Improper body mechanics, Postural dysfunction, Decreased activity tolerance, Hypermobility, Pain, Impaired UE functional use  Visit Diagnosis: Chronic left shoulder pain     Problem List Patient Active Problem List   Diagnosis Date Noted   Shoulder impingement syndrome, left 10/17/2020   Soft tissue swelling 09/07/2020   Left shoulder pain 09/07/2020    Patellofemoral pain syndrome of left knee 09/07/2020   Dermatochalasis of upper and lower eyelids of both eyes 05/27/2017   Activity, other specified 07/06/2015  Biliary colic 09/26/2014   Gallbladder sludge 09/26/2014   Other specified diseases of gallbladder 09/26/2014   ICD (implantable cardioverter-defibrillator) in place 03/18/2014   Chest pain, unspecified 08/09/2013   GERD (gastroesophageal reflux disease) 08/09/2013   NSVT (nonsustained ventricular tachycardia) 08/09/2013   Hypertrophic cardiomyopathy (HCC) 07/04/2013   Syncope 06/21/2012   Josearmando Kuhnert C. Jceon Alverio PT, DPT 02/20/21 11:51 AM   [facility] [address] [place], [place], [address] Phone: [phone]   Fax:  [phone]  Name: David Booker MRN: [id number] Date of Birth: 10-15-1975

## 2021-02-22 ENCOUNTER — Other Ambulatory Visit: Payer: Self-pay

## 2021-02-22 ENCOUNTER — Ambulatory Visit (HOSPITAL_BASED_OUTPATIENT_CLINIC_OR_DEPARTMENT_OTHER): Payer: BC Managed Care – PPO | Admitting: Physical Therapy

## 2021-02-22 ENCOUNTER — Encounter (HOSPITAL_BASED_OUTPATIENT_CLINIC_OR_DEPARTMENT_OTHER): Payer: Self-pay | Admitting: Physical Therapy

## 2021-02-22 DIAGNOSIS — G8929 Other chronic pain: Secondary | ICD-10-CM | POA: Diagnosis not present

## 2021-02-22 DIAGNOSIS — M25512 Pain in left shoulder: Secondary | ICD-10-CM | POA: Diagnosis not present

## 2021-02-22 NOTE — Therapy (Signed)
[facility] [address] [place], [place], [address] Phone: [phone]   Fax:  [phone]  Physical Therapy Treatment  Patient Details  Name: David Booker MRN: [id number] Date of Birth: Oct 30, 1975 Referring Provider (PT): [name]   Encounter Date: 02/22/2021   PT End of Session - 02/22/21 1017     Visit Number 10    Number of Visits 13    Date for PT Re-Evaluation 03/02/21    Authorization Type BCBS    PT Start Time 1016    PT Stop Time 1054    PT Time Calculation (min) 38 min    Activity Tolerance Patient tolerated treatment well    Behavior During Therapy [facility] for tasks assessed/performed             Past Medical History:  Diagnosis Date   Abnormal heart rhythm    Allergic rhinitis, cause unspecified    Anxiety    Back pain    without radiation   Bradycardia    Noted on event monitor (04/20/12-05/20/12) Just before bradycardia was recorded, HR was approx. 130bpm   Depressive disorder, not elsewhere classified    Esophageal reflux    Chronic but his current pain appears to be different than his radiating to the back. Possible biliary disease. (04/22/13 Dr. Carman Ching, GI)   Genital HSV    Heartburn    Long Hx   Near syncope    While wearing event monitor (04/20/12-05/20/12) 46bpm. Also had chest tightness at this time. Possible vagal [name] 05/22/12)   Other specified circulatory system disorders    Palpitations    PVC (premature ventricular contraction)    Noted on event monitor (04/20/12-05/20/12)    Substernal chest pain 04/22/13   Pt reports having vague symptoms of substernal CP, different from heartburn in that it is dull & radiatesto the back. Occurring at least 3 episodes over past several weeks. Pain free in between episodes. (04/22/13 Dr. Carman Ching GI)    Syncope    Syncope and collapse    Tachycardia, unspecified    Viral syndrome    Wide QRS ventricular tachycardia    Noted on  event monitor (04/20/12-05/20/12) at night    Past Surgical History:  Procedure Laterality Date   CARDIAC DEFIBRILLATOR PLACEMENT  11/04/13   SJM Fortify Assura DR implanted by Dr [name] for hypertrophic CM and syncope   IMPLANTABLE CARDIOVERTER DEFIBRILLATOR IMPLANT N/A 11/04/2013   Procedure: IMPLANTABLE CARDIOVERTER DEFIBRILLATOR IMPLANT;  Surgeon: Gardiner Rhyme, MD;  Location: [facility] CATH LAB;  Service: Cardiovascular;  Laterality: N/A;   SHOULDER SURGERY Right    Dislocated    There were no vitals filed for this visit.   Subjective Assessment - 02/22/21 1017     Subjective My back still hurts. Shoulder is doing okay. Have not done any activities that particularly hurt shoulder.    Patient Stated Goals driving, working, reaching    Currently in Pain? No/denies                [facility] PT Assessment - 02/22/21 0001       AROM   Overall AROM Comments 90-120 flexion results in Lt horiz abd as compensation and Rt shoulder elevates    AROM Assessment Site Shoulder    Right/Left Shoulder Left    Left Shoulder Flexion 140 Degrees   when utilizing proper form- able to reach to 180 due to hypermobility  [facility] Adult PT Treatment/Exercise - 02/22/21 0001       Shoulder Exercises: Seated   Row Limitations OKC row 1lb reaching to 90 deg    Flexion Limitations 1lb each hand, lift to 90, lift to OH; 90-130; 90-OH    Diagonals Limitations D1 flexion 1lb each hand      Shoulder Exercises: Standing   Other Standing Exercises plank walk out- feet on floor, hands on table                          PT Long Term Goals - 01/16/21 1236       PT LONG TERM GOAL #1   Title quick Dash to improve by MDC (16 points)    Baseline 52.3 at eval    Time 6    Period Weeks    Status New    Target Date 03/02/21      PT LONG TERM GOAL #2   Title SPADI to change by MDC (21.5)    Baseline 51 at eval    Time 6    Period Weeks    Status New     Target Date 03/02/21      PT LONG TERM GOAL #3   Title pt will be able to drive wihtout limitation by shoulder pain    Baseline notable at eval    Time 6    Period Weeks    Status New    Target Date 03/02/21      PT LONG TERM GOAL #4   Title able to demo stable GHJ in MMT    Baseline unstable with shifting notable at eval    Time 6    Period Weeks    Status New    Target Date 03/02/21                   Plan - 02/22/21 1240     Clinical Impression Statement continued progressing to upright posture and long axis UE motions. focused primarily in flexion today with focus on reducing elevation and obtaining scapular retraction. Evident tightness in inferior and inferior/posterior capsule resulting in limited flexion ROM compared to what he is used to. Could feel that uncontrolled flexion resulted in elevation and forward head due to overactivation of upper traps.    PT Treatment/Interventions ADLs/Self Care Home Management;Cryotherapy;Moist Heat;Functional mobility training;Therapeutic activities;Therapeutic exercise;Neuromuscular re-education;Manual techniques;Patient/family education;Passive range of motion;Dry needling;Taping    PT Next Visit Plan rev flexion- move to scaption & abd    PT Home Exercise Plan [id number]    Consulted and Agree with Plan of Care Patient             Patient will benefit from skilled therapeutic intervention in order to improve the following deficits and impairments:  Improper body mechanics, Postural dysfunction, Decreased activity tolerance, Hypermobility, Pain, Impaired UE functional use  Visit Diagnosis: Chronic left shoulder pain     Problem List Patient Active Problem List   Diagnosis Date Noted   Shoulder impingement syndrome, left 10/17/2020   Soft tissue swelling 09/07/2020   Left shoulder pain 09/07/2020   Patellofemoral pain syndrome of left knee 09/07/2020   Dermatochalasis of upper and lower eyelids of both eyes  05/27/2017   Activity, other specified 07/06/2015   Biliary colic 09/26/2014   Gallbladder sludge 09/26/2014   Other specified diseases of gallbladder 09/26/2014   ICD (implantable cardioverter-defibrillator) in place 03/18/2014   Chest pain, unspecified 08/09/2013   GERD (gastroesophageal reflux  disease) 08/09/2013   NSVT (nonsustained ventricular tachycardia) 08/09/2013   Hypertrophic cardiomyopathy (HCC) 07/04/2013   Syncope 06/21/2012   Delsa Walder C. Nakeeta Sebastiani PT, DPT 02/22/21 12:42 PM    [facility] [address] [place], [place], [address] Phone: [phone]   Fax:  [phone]  Name: David Booker MRN: [id number] Date of Birth: 1975/11/29

## 2021-02-27 ENCOUNTER — Ambulatory Visit (HOSPITAL_BASED_OUTPATIENT_CLINIC_OR_DEPARTMENT_OTHER): Payer: BC Managed Care – PPO | Admitting: Physical Therapy

## 2021-02-27 ENCOUNTER — Encounter (HOSPITAL_BASED_OUTPATIENT_CLINIC_OR_DEPARTMENT_OTHER): Payer: Self-pay | Admitting: Physical Therapy

## 2021-02-27 ENCOUNTER — Other Ambulatory Visit: Payer: Self-pay

## 2021-02-27 DIAGNOSIS — G8929 Other chronic pain: Secondary | ICD-10-CM

## 2021-02-27 DIAGNOSIS — M25512 Pain in left shoulder: Secondary | ICD-10-CM | POA: Diagnosis not present

## 2021-02-27 NOTE — Therapy (Signed)
[facility] [address] [place], [place], [address] Phone: [phone]   Fax:  [phone]  Physical Therapy Treatment  Patient Details  Name: David Booker MRN: [id number] Date of Birth: 25-Jun-1975 Referring Provider (PT): [name]   Encounter Date: 02/27/2021   PT End of Session - 02/27/21 1016     Visit Number 11    Number of Visits 13    Date for PT Re-Evaluation 03/02/21    Authorization Type BCBS    Authorization - Number of Visits 30    PT Start Time 1016    PT Stop Time 1100    PT Time Calculation (min) 44 min    Activity Tolerance Patient tolerated treatment well    Behavior During Therapy [facility] for tasks assessed/performed             Past Medical History:  Diagnosis Date   Abnormal heart rhythm    Allergic rhinitis, cause unspecified    Anxiety    Back pain    without radiation   Bradycardia    Noted on event monitor (04/20/12-05/20/12) Just before bradycardia was recorded, HR was approx. 130bpm   Depressive disorder, not elsewhere classified    Esophageal reflux    Chronic but his current pain appears to be different than his radiating to the back. Possible biliary disease. (04/22/13 Dr. Carman Ching, GI)   Genital HSV    Heartburn    Long Hx   Near syncope    While wearing event monitor (04/20/12-05/20/12) 46bpm. Also had chest tightness at this time. Possible vagal [name] 05/22/12)   Other specified circulatory system disorders    Palpitations    PVC (premature ventricular contraction)    Noted on event monitor (04/20/12-05/20/12)    Substernal chest pain 04/22/13   Pt reports having vague symptoms of substernal CP, different from heartburn in that it is dull & radiatesto the back. Occurring at least 3 episodes over past several weeks. Pain free in between episodes. (04/22/13 Dr. Carman Ching GI)    Syncope    Syncope and collapse    Tachycardia, unspecified    Viral syndrome    Wide  QRS ventricular tachycardia    Noted on event monitor (04/20/12-05/20/12) at night    Past Surgical History:  Procedure Laterality Date   CARDIAC DEFIBRILLATOR PLACEMENT  11/04/13   SJM Fortify Assura DR implanted by Dr [name] for hypertrophic CM and syncope   IMPLANTABLE CARDIOVERTER DEFIBRILLATOR IMPLANT N/A 11/04/2013   Procedure: IMPLANTABLE CARDIOVERTER DEFIBRILLATOR IMPLANT;  Surgeon: Gardiner Rhyme, MD;  Location: [facility] CATH LAB;  Service: Cardiovascular;  Laterality: N/A;   SHOULDER SURGERY Right    Dislocated    There were no vitals filed for this visit.   Subjective Assessment - 02/27/21 1017     Subjective Shoulder is doing okay, the back got about 85% better and stayed there. Stabbing pain in lower back with shooting pain into Rt leg and sometimes Lt.    Patient Stated Goals driving, working, reaching    Currently in Pain? No/denies    Pain Location Shoulder                [facility] PT Assessment - 02/27/21 0001       Assessment   Medical Diagnosis left shoulder impingement    Referring Provider (PT) [name]      Posture/Postural Control   Posture Comments supine pelvic shift to the Rt, Rt post innom rotatio with functional LLD  [facility] Adult PT Treatment/Exercise - 02/27/21 0001       Shoulder Exercises: Seated   Flexion Limitations active asssited setting to alignment to improve OH reach- flexion & scaption, assisted in motion as well as passively placing and then isometric holds    Other Seated Exercises seated retraction      Shoulder Exercises: Stretch   Other Shoulder Stretches Rt sidelying over pillow 3 min      Manual Therapy   Manual Therapy Joint mobilization    Joint Mobilization manual post rotation of Rt innom      Kinesiotix   Facilitate Muscle  Lt GHJ- ant&mid deltoid, post deltoid to line of supraspinatus, perpendicular to deltoid wrap                     PT Education - 02/27/21 1229      Education Details anatomy & biomechanics of shoulder complex using model    Person(s) Educated Patient    Methods Explanation;Demonstration    Comprehension Verbalized understanding;Need further instruction                 PT Long Term Goals - 01/16/21 1236       PT LONG TERM GOAL #1   Title quick Dash to improve by MDC (16 points)    Baseline 52.3 at eval    Time 6    Period Weeks    Status New    Target Date 03/02/21      PT LONG TERM GOAL #2   Title SPADI to change by MDC (21.5)    Baseline 51 at eval    Time 6    Period Weeks    Status New    Target Date 03/02/21      PT LONG TERM GOAL #3   Title pt will be able to drive wihtout limitation by shoulder pain    Baseline notable at eval    Time 6    Period Weeks    Status New    Target Date 03/02/21      PT LONG TERM GOAL #4   Title able to demo stable GHJ in MMT    Baseline unstable with shifting notable at eval    Time 6    Period Weeks    Status New    Target Date 03/02/21                   Plan - 02/27/21 1310     Clinical Impression Statement Rt lateral shift of pelvis noted in supine without s/s consistent with neural involvement. Addressed with manual mobilization and passive sidelying over pillows and pt reported relief following. Difficulty with depression of humeral head in acetabulum in overhead motions- was able to obtain proper position with repeated assistance and will continue to work on this. overactivation of pecs noted creating anterior pull on humeral head and creating a sensation of "tightness" on posterior shoulder.    PT Treatment/Interventions ADLs/Self Care Home Management;Cryotherapy;Moist Heat;Functional mobility training;Therapeutic activities;Therapeutic exercise;Neuromuscular re-education;Manual techniques;Patient/family education;Passive range of motion;Dry needling;Taping    PT Next Visit Plan cont high levels of flexion & stabiility, re-cert due    PT Home Exercise  Plan [id number]    Consulted and Agree with Plan of Care Patient             Patient will benefit from skilled therapeutic intervention in order to improve the following deficits and impairments:  Improper body mechanics, Postural dysfunction, Decreased activity tolerance, Hypermobility,  Pain, Impaired UE functional use  Visit Diagnosis: Chronic left shoulder pain     Problem List Patient Active Problem List   Diagnosis Date Noted   Shoulder impingement syndrome, left 10/17/2020   Soft tissue swelling 09/07/2020   Left shoulder pain 09/07/2020   Patellofemoral pain syndrome of left knee 09/07/2020   Dermatochalasis of upper and lower eyelids of both eyes 05/27/2017   Activity, other specified 07/06/2015   Biliary colic 09/26/2014   Gallbladder sludge 09/26/2014   Other specified diseases of gallbladder 09/26/2014   ICD (implantable cardioverter-defibrillator) in place 03/18/2014   Chest pain, unspecified 08/09/2013   GERD (gastroesophageal reflux disease) 08/09/2013   NSVT (nonsustained ventricular tachycardia) 08/09/2013   Hypertrophic cardiomyopathy (HCC) 07/04/2013   Syncope 06/21/2012   Kanitra Purifoy C. Felica Chargois PT, DPT 02/27/21 1:15 PM   [facility] [address] [place], [place], [address] Phone: [phone]   Fax:  [phone]  Name: David Booker MRN: [id number] Date of Birth: 1975/05/26

## 2021-03-02 ENCOUNTER — Other Ambulatory Visit: Payer: Self-pay

## 2021-03-02 ENCOUNTER — Ambulatory Visit (HOSPITAL_BASED_OUTPATIENT_CLINIC_OR_DEPARTMENT_OTHER): Payer: BC Managed Care – PPO | Admitting: Physical Therapy

## 2021-03-02 ENCOUNTER — Encounter (HOSPITAL_BASED_OUTPATIENT_CLINIC_OR_DEPARTMENT_OTHER): Payer: Self-pay | Admitting: Physical Therapy

## 2021-03-02 DIAGNOSIS — M25512 Pain in left shoulder: Secondary | ICD-10-CM | POA: Diagnosis not present

## 2021-03-02 DIAGNOSIS — G8929 Other chronic pain: Secondary | ICD-10-CM | POA: Diagnosis not present

## 2021-03-03 ENCOUNTER — Encounter (HOSPITAL_BASED_OUTPATIENT_CLINIC_OR_DEPARTMENT_OTHER): Payer: Self-pay | Admitting: Physical Therapy

## 2021-03-03 NOTE — Therapy (Signed)
[facility] [address] [place], [place], [address] Phone: [phone]   Fax:  [phone]  Physical Therapy Treatment/Re-certification  Patient Details  Name: David Booker MRN: [id number] Date of Birth: 08-Jun-1975 Referring Provider (PT): [place]   Encounter Date: 03/02/2021   PT End of Session - 03/02/21 1016     Visit Number 12    Number of Visits 24    Date for PT Re-Evaluation 04/14/21    Authorization Type BCBS    PT Start Time 1016    PT Stop Time 1054    PT Time Calculation (min) 38 min    Activity Tolerance Patient tolerated treatment well    Behavior During Therapy [facility] for tasks assessed/performed             Past Medical History:  Diagnosis Date   Abnormal heart rhythm    Allergic rhinitis, cause unspecified    Anxiety    Back pain    without radiation   Bradycardia    Noted on event monitor (04/20/12-05/20/12) Just before bradycardia was recorded, HR was approx. 130bpm   Depressive disorder, not elsewhere classified    Esophageal reflux    Chronic but his current pain appears to be different than his radiating to the back. Possible biliary disease. (04/22/13 Dr. Laurence Spates, GI)   Genital HSV    Heartburn    Long Hx   Near syncope    While wearing event monitor (04/20/12-05/20/12) 46bpm. Also had chest tightness at this time. Possible vagal Marlou Porch 05/22/12)   Other specified circulatory system disorders    Palpitations    PVC (premature ventricular contraction)    Noted on event monitor (04/20/12-05/20/12)    Substernal chest pain 04/22/13   Pt reports having vague symptoms of substernal CP, different from heartburn in that it is dull & radiatesto the back. Occurring at least 3 episodes over past several weeks. Pain free in between episodes. (04/22/13 Dr. Laurence Spates GI)    Syncope    Syncope and collapse    Tachycardia, unspecified    Viral syndrome    Wide QRS ventricular  tachycardia    Noted on event monitor (04/20/12-05/20/12) at night    Past Surgical History:  Procedure Laterality Date   CARDIAC DEFIBRILLATOR PLACEMENT  11/04/13   SJM Fortify Assura DR implanted by Dr Rayann Heman for hypertrophic CM and syncope   IMPLANTABLE CARDIOVERTER DEFIBRILLATOR IMPLANT N/A 11/04/2013   Procedure: IMPLANTABLE CARDIOVERTER DEFIBRILLATOR IMPLANT;  Surgeon: Coralyn Mark, MD;  Location: [facility] CATH LAB;  Service: Cardiovascular;  Laterality: N/A;   SHOULDER SURGERY Right    Dislocated    There were no vitals filed for this visit.   Subjective Assessment - 03/02/21 1017     Subjective Shoulder is doing okay. back is not great- laying on side helps but not long term. starting to get pain lateral Rt leg to mid calf.    Patient Stated Goals driving, working, reaching    Currently in Pain? No/denies                [facility] PT Assessment - 03/03/21 0001       Assessment   Medical Diagnosis left shoulder impingement    Referring Provider (PT) de [place]    Hand Dominance Right      Precautions   Precautions ICD/Pacemaker      Restrictions   Weight Bearing Restrictions No      Sensation   Additional Comments not in hands, down  lateral Rt leg      Posture/Postural Control   Posture Comments Lt ant innom rotation with limited spring recoil vs Rt; no notable lateral shift      Strength   Overall Strength Comments gross 5/5, compensation only noted in flexion                           [facility] Adult PT Treatment/Exercise - 03/03/21 0001       Shoulder Exercises: Supine   Other Supine Exercises ab set with ball bw knees- added al GHJ flexion; also with LE at 90/90    Other Supine Exercises core stabilization marching- with and without assist      Shoulder Exercises: Standing   Other Standing Exercises bent over triceps kicks, shoulder extension all with 1lb                     PT Education - 03/03/21 1147     Education Details  anatomy of condition, goals review & added new, POC    Person(s) Educated Patient    Methods Explanation    Comprehension Verbalized understanding;Need further instruction                 PT Long Term Goals - 03/02/21 1023       PT LONG TERM GOAL #1   Title quick Dash to improve by MDC (16 points)    Baseline 52.3 at eval; 15.9 on 10/21    Status Achieved      PT LONG TERM GOAL #2   Title SPADI to change by [place] (21.5)    Baseline 51 at eval, 9 on 10/21    Status Achieved      PT LONG TERM GOAL #3   Title pt will be able to drive wihtout limitation by shoulder pain    Baseline has made necessary changes    Status Achieved      PT LONG TERM GOAL #4   Title able to demo stable GHJ in MMT    Baseline elevation and difficulty controlling in flexion MMT    Status Partially Met      PT LONG TERM GOAL #5   Title able to don/doff tshirt without notable instability    Baseline noted every day    Time 6    Period Weeks    Status New    Target Date 04/13/21      Additional Long Term Goals   Additional Long Term Goals Yes      PT LONG TERM GOAL #6   Title resolution of back pain to avoid postural compensations affecting shoulder    Time 6    Period Weeks    Status New    Target Date 04/13/21      PT LONG TERM GOAL #7   Title able to return to bike riding wihtout limitation by shoulder    Time 6    Period Weeks    Status New    Target Date 04/13/21                   Plan - 03/03/21 1147     Clinical Impression Statement Pt has made significant progress in his reported functional use of UE with decreased limitation by pain. Each progression of ROM challenges and coordination exercises demos instability and poor control of hypermobile joints. Added lumbar spine to treatment plan as biomechanical chain reactions with incr lumbar pain have  resulted in decreased control of shoulder. Pt is grossly hypermobile and pelvis is very quickly corrected with manual  therapy- indicating need for gross stability program throughout the trunk. This increase in central stability will improve distal control. Pt will cont to benefit from skilled PT to address deficits and continue progressing toward long term functional goals.    PT Frequency 2x / week    PT Duration 6 weeks    PT Treatment/Interventions ADLs/Self Care Home Management;Cryotherapy;Moist Heat;Functional mobility training;Therapeutic activities;Therapeutic exercise;Neuromuscular re-education;Manual techniques;Patient/family education;Passive range of motion;Dry needling;Taping    PT Next Visit Plan cont core stability program, OH control of GHJ/scapula    PT Home Exercise Plan [id number]    Consulted and Agree with Plan of Care Patient             Patient will benefit from skilled therapeutic intervention in order to improve the following deficits and impairments:  Improper body mechanics, Postural dysfunction, Decreased activity tolerance, Hypermobility, Pain, Impaired UE functional use, Increased muscle spasms  Visit Diagnosis: Chronic left shoulder pain - Plan: PT plan of care cert/re-cert     Problem List Patient Active Problem List   Diagnosis Date Noted   Shoulder impingement syndrome, left 10/17/2020   Soft tissue swelling 09/07/2020   Left shoulder pain 09/07/2020   Patellofemoral pain syndrome of left knee 09/07/2020   Dermatochalasis of upper and lower eyelids of both eyes 05/27/2017   Activity, other specified 03/11/1313   Biliary colic 38/88/7579   Gallbladder sludge 09/26/2014   Other specified diseases of gallbladder 09/26/2014   ICD (implantable cardioverter-defibrillator) in place 03/18/2014   Chest pain, unspecified 08/09/2013   GERD (gastroesophageal reflux disease) 08/09/2013   NSVT (nonsustained ventricular tachycardia) 08/09/2013   Hypertrophic cardiomyopathy ([place]) 07/04/2013   Syncope 06/21/2012   Beth Spackman C. Pamlea Finder PT, DPT 03/03/21 11:55 AM  [facility] [place], [place], [address] Phone: [phone]   Fax:  [phone]  Name: Ludger Bones MRN: [id number] Date of Birth: 05/22/1975

## 2021-03-05 ENCOUNTER — Other Ambulatory Visit: Payer: Self-pay

## 2021-03-05 ENCOUNTER — Encounter (HOSPITAL_BASED_OUTPATIENT_CLINIC_OR_DEPARTMENT_OTHER): Payer: Self-pay | Admitting: Physical Therapy

## 2021-03-05 ENCOUNTER — Encounter (HOSPITAL_BASED_OUTPATIENT_CLINIC_OR_DEPARTMENT_OTHER): Payer: BC Managed Care – PPO | Attending: Family Medicine | Admitting: Physical Therapy

## 2021-03-05 DIAGNOSIS — M25512 Pain in left shoulder: Secondary | ICD-10-CM | POA: Diagnosis not present

## 2021-03-05 DIAGNOSIS — G8929 Other chronic pain: Secondary | ICD-10-CM | POA: Insufficient documentation

## 2021-03-05 NOTE — Therapy (Signed)
[facility] [address] [place], [place], [address] Phone: [phone]   Fax:  [phone]  Physical Therapy Treatment  Patient Details  Name: David Booker MRN: [id number] Date of Birth: 03-01-1976 Referring Provider (PT): [place]   Encounter Date: 03/05/2021   PT End of Session - 03/05/21 1304     Visit Number 13    Number of Visits 24    Date for PT Re-Evaluation 04/14/21    Authorization Type BCBS    Authorization - Number of Visits 30    PT Start Time 1300    PT Stop Time 1345    PT Time Calculation (min) 45 min    Activity Tolerance Patient tolerated treatment well    Behavior During Therapy [facility] for tasks assessed/performed             Past Medical History:  Diagnosis Date   Abnormal heart rhythm    Allergic rhinitis, cause unspecified    Anxiety    Back pain    without radiation   Bradycardia    Noted on event monitor (04/20/12-05/20/12) Just before bradycardia was recorded, HR was approx. 130bpm   Depressive disorder, not elsewhere classified    Esophageal reflux    Chronic but his current pain appears to be different than his radiating to the back. Possible biliary disease. (04/22/13 Dr. Laurence Spates, GI)   Genital HSV    Heartburn    Long Hx   Near syncope    While wearing event monitor (04/20/12-05/20/12) 46bpm. Also had chest tightness at this time. Possible vagal Marlou Porch 05/22/12)   Other specified circulatory system disorders    Palpitations    PVC (premature ventricular contraction)    Noted on event monitor (04/20/12-05/20/12)    Substernal chest pain 04/22/13   Pt reports having vague symptoms of substernal CP, different from heartburn in that it is dull & radiatesto the back. Occurring at least 3 episodes over past several weeks. Pain free in between episodes. (04/22/13 Dr. Laurence Spates GI)    Syncope    Syncope and collapse    Tachycardia, unspecified    Viral syndrome    Wide  QRS ventricular tachycardia    Noted on event monitor (04/20/12-05/20/12) at night    Past Surgical History:  Procedure Laterality Date   CARDIAC DEFIBRILLATOR PLACEMENT  11/04/13   SJM Fortify Assura DR implanted by Dr Rayann Heman for hypertrophic CM and syncope   IMPLANTABLE CARDIOVERTER DEFIBRILLATOR IMPLANT N/A 11/04/2013   Procedure: IMPLANTABLE CARDIOVERTER DEFIBRILLATOR IMPLANT;  Surgeon: Coralyn Mark, MD;  [facility];  Service: Cardiovascular;  Laterality: N/A;   SHOULDER SURGERY Right    Dislocated    There were no vitals filed for this visit.   Subjective Assessment - 03/05/21 1306     Subjective Shoulder is good. Back was fine Friday but hurt when i woke up Sat AM, heat and aleve helped a little bit. Lower Rt lumbar with radiating to lateral right leg.    Patient Stated Goals driving, working, reaching    Currently in Pain? Yes    Pain Score 4     Pain Location Back    Pain Orientation Right;Lower    Pain Descriptors / Indicators --   annoying   Pain Radiating Towards lateral right thigh    Aggravating Factors  laying supine    Pain Relieving Factors heat  [facility] PT Treatment/Exercise - 03/05/21 0001       Shoulder Exercises: Supine   Other Supine Exercises ab set with ball, prog to 90/90, added bil knee ext- to ceiling, to diagonal    Other Supine Exercises SLR from supine with and without external support to pelvis      Shoulder Exercises: Seated   Other Seated Exercises seated roundback with horiz abd 1lb; alternating shoulder flexion with LE turnout    Other Seated Exercises high kneeling- alt flexion, alt D2 flexion      Shoulder Exercises: Prone   Other Prone Exercises knee plank hands on fitter                          PT Long Term Goals - 03/02/21 1023       PT LONG TERM GOAL #1   Title quick Dash to improve by MDC (16 points)    Baseline 52.3 at eval; 15.9 on 10/21     Status Achieved      PT LONG TERM GOAL #2   Title SPADI to change by [place] (21.5)    Baseline 51 at eval, 9 on 10/21    Status Achieved      PT LONG TERM GOAL #3   Title pt will be able to drive wihtout limitation by shoulder pain    Baseline has made necessary changes    Status Achieved      PT LONG TERM GOAL #4   Title able to demo stable GHJ in MMT    Baseline elevation and difficulty controlling in flexion MMT    Status Partially Met      PT LONG TERM GOAL #5   Title able to don/doff tshirt without notable instability    Baseline noted every day    Time 6    Period Weeks    Status New    Target Date 04/13/21      Additional Long Term Goals   Additional Long Term Goals Yes      PT LONG TERM GOAL #6   Title resolution of back pain to avoid postural compensations affecting shoulder    Time 6    Period Weeks    Status New    Target Date 04/13/21      PT LONG TERM GOAL #7   Title able to return to bike riding wihtout limitation by shoulder    Time 6    Period Weeks    Status New    Target Date 04/13/21                   Plan - 03/05/21 1755     Clinical Impression Statement Full biomechanical chain incoorporated between core and upper extremities. Notable fatigue in core with challenges in posture but was able to perform a good set of the shoulder in [facility] reach. soreness in QL decreases with activation of core and SIJ discomfort resolved with pressure applied by PT in supine position indicating further need for proximal stability.    PT Treatment/Interventions ADLs/Self Care Home Management;Cryotherapy;Moist Heat;Functional mobility training;Therapeutic activities;Therapeutic exercise;Neuromuscular re-education;Manual techniques;Patient/family education;Passive range of motion;Dry needling;Taping    PT Next Visit Plan cont core stability program, OH control of GHJ/scapula    PT Home Exercise Plan [id number]    Consulted and Agree with Plan of Care Patient              Patient will benefit from skilled therapeutic intervention in  order to improve the following deficits and impairments:  Improper body mechanics, Postural dysfunction, Decreased activity tolerance, Hypermobility, Pain, Impaired UE functional use, Increased muscle spasms  Visit Diagnosis: Chronic left shoulder pain     Problem List Patient Active Problem List   Diagnosis Date Noted   Shoulder impingement syndrome, left 10/17/2020   Soft tissue swelling 09/07/2020   Left shoulder pain 09/07/2020   Patellofemoral pain syndrome of left knee 09/07/2020   Dermatochalasis of upper and lower eyelids of both eyes 05/27/2017   Activity, other specified 71/10/2692   Biliary colic 85/46/2703   Gallbladder sludge 09/26/2014   Other specified diseases of gallbladder 09/26/2014   ICD (implantable cardioverter-defibrillator) in place 03/18/2014   Chest pain, unspecified 08/09/2013   GERD (gastroesophageal reflux disease) 08/09/2013   NSVT (nonsustained ventricular tachycardia) 08/09/2013   Hypertrophic cardiomyopathy ([place]) 07/04/2013   Syncope 06/21/2012    Kahlen Boyde C. Bernisha Verma PT, DPT 03/05/21 5:58 PM  [facility] [address] [place], [place], [address] Phone: [phone]   Fax:  [phone]  Name: David Booker MRN: [id number] Date of Birth: [birth year]

## 2021-03-13 ENCOUNTER — Encounter (HOSPITAL_BASED_OUTPATIENT_CLINIC_OR_DEPARTMENT_OTHER): Payer: Self-pay | Admitting: Physical Therapy

## 2021-03-16 ENCOUNTER — Encounter (HOSPITAL_BASED_OUTPATIENT_CLINIC_OR_DEPARTMENT_OTHER): Payer: Self-pay | Admitting: Physical Therapy

## 2021-03-16 ENCOUNTER — Ambulatory Visit (HOSPITAL_BASED_OUTPATIENT_CLINIC_OR_DEPARTMENT_OTHER): Payer: BC Managed Care – PPO | Attending: Family Medicine | Admitting: Physical Therapy

## 2021-03-16 ENCOUNTER — Other Ambulatory Visit: Payer: Self-pay

## 2021-03-16 DIAGNOSIS — M25512 Pain in left shoulder: Secondary | ICD-10-CM | POA: Insufficient documentation

## 2021-03-16 DIAGNOSIS — G8929 Other chronic pain: Secondary | ICD-10-CM | POA: Insufficient documentation

## 2021-03-16 NOTE — Therapy (Signed)
[facility] [address] [place], [place], [address] Phone: [phone]   Fax:  [phone]  Physical Therapy Treatment  Patient Details  Name: David Booker MRN: [id number] Date of Birth: October 17, 1975 Referring Provider (PT): [name]   Encounter Date: 03/16/2021   PT End of Session - 03/16/21 1316     Visit Number 14    Number of Visits 24    Date for PT Re-Evaluation 04/14/21    Authorization Type BCBS    Authorization - Number of Visits 30    PT Start Time 1315    PT Stop Time 1355    PT Time Calculation (min) 40 min    Activity Tolerance Patient tolerated treatment well    Behavior During Therapy [facility] for tasks assessed/performed             Past Medical History:  Diagnosis Date   Abnormal heart rhythm    Allergic rhinitis, cause unspecified    Anxiety    Back pain    without radiation   Bradycardia    Noted on event monitor (04/20/12-05/20/12) Just before bradycardia was recorded, HR was approx. 130bpm   Depressive disorder, not elsewhere classified    Esophageal reflux    Chronic but his current pain appears to be different than his radiating to the back. Possible biliary disease. (04/22/13 Dr. Carman Ching, GI)   Genital HSV    Heartburn    Long Hx   Near syncope    While wearing event monitor (04/20/12-05/20/12) 46bpm. Also had chest tightness at this time. Possible vagal [name] 05/22/12)   Other specified circulatory system disorders    Palpitations    PVC (premature ventricular contraction)    Noted on event monitor (04/20/12-05/20/12)    Substernal chest pain 04/22/13   Pt reports having vague symptoms of substernal CP, different from heartburn in that it is dull & radiatesto the back. Occurring at least 3 episodes over past several weeks. Pain free in between episodes. (04/22/13 Dr. Carman Ching GI)    Syncope    Syncope and collapse    Tachycardia, unspecified    Viral syndrome    Wide  QRS ventricular tachycardia    Noted on event monitor (04/20/12-05/20/12) at night    Past Surgical History:  Procedure Laterality Date   CARDIAC DEFIBRILLATOR PLACEMENT  11/04/13   SJM Fortify Assura DR implanted by Dr [name] for hypertrophic CM and syncope   IMPLANTABLE CARDIOVERTER DEFIBRILLATOR IMPLANT N/A 11/04/2013   Procedure: IMPLANTABLE CARDIOVERTER DEFIBRILLATOR IMPLANT;  Surgeon: Gardiner Rhyme, MD;  Location: [facility] CATH LAB;  Service: Cardiovascular;  Laterality: N/A;   SHOULDER SURGERY Right    Dislocated    There were no vitals filed for this visit.   Subjective Assessment - 03/16/21 1317     Subjective i was in [place] for a while and did not notice the pain. shoulder is doing well.    Patient Stated Goals driving, working, reaching    Currently in Pain? No/denies                [facility] PT Assessment - 03/16/21 0001       Strength   Overall Strength Comments instability at 140 from lateral to medial pressure (P)                            [facility] Adult PT Treatment/Exercise - 03/16/21 0001       Shoulder Exercises: Standing  Other Standing Exercises yellow tband- IR & ER at 90 abd; bil high row    Other Standing Exercises body blade- neutral lateral & A/P, abd to 45 deg; flexion to 90, overhead                          PT Long Term Goals - 03/16/21 1320       PT LONG TERM GOAL #1   Title quick Dash to improve by MDC (16 points)    Baseline 52.3 at eval; 15.9 on 10/21    Status Achieved      PT LONG TERM GOAL #2   Title SPADI to change by MDC (21.5)    Baseline 51 at eval, 9 on 10/21    Status Achieved      PT LONG TERM GOAL #3   Title pt will be able to drive wihtout limitation by shoulder pain    Baseline has made necessary changes    Status Achieved      PT LONG TERM GOAL #4   Title able to demo stable GHJ in MMT    Baseline achieved for straight plane testing at shoulder height    Status Achieved      PT  LONG TERM GOAL #5   Title able to don/doff tshirt without notable instability    Baseline still feeling unstable but it does not hurt at this point    Status On-going      PT LONG TERM GOAL #6   Title resolution of back pain to avoid postural compensations affecting shoulder    Status Achieved      PT LONG TERM GOAL #7   Title able to return to bike riding wihtout limitation by shoulder    Baseline have not tried at this point    Status On-going                   Plan - 03/16/21 1417     Clinical Impression Statement improved stability at shoulder ehight and below but still has poor coordination overhead leading to feeling of instability. pain is decreased but will return if stability is not improved in funtional motions.    PT Treatment/Interventions ADLs/Self Care Home Management;Cryotherapy;Moist Heat;Functional mobility training;Therapeutic activities;Therapeutic exercise;Neuromuscular re-education;Manual techniques;Patient/family education;Passive range of motion;Dry needling;Taping    PT Next Visit Plan cont overhead stability, Y liftoff without band, ball on wall overhead    PT Home Exercise Plan [id number]    Consulted and Agree with Plan of Care Patient             Patient will benefit from skilled therapeutic intervention in order to improve the following deficits and impairments:  Improper body mechanics, Postural dysfunction, Decreased activity tolerance, Hypermobility, Pain, Impaired UE functional use, Increased muscle spasms  Visit Diagnosis: Chronic left shoulder pain     Problem List Patient Active Problem List   Diagnosis Date Noted   Shoulder impingement syndrome, left 10/17/2020   Soft tissue swelling 09/07/2020   Left shoulder pain 09/07/2020   Patellofemoral pain syndrome of left knee 09/07/2020   Dermatochalasis of upper and lower eyelids of both eyes 05/27/2017   Activity, other specified 07/06/2015   Biliary colic 09/26/2014   Gallbladder  sludge 09/26/2014   Other specified diseases of gallbladder 09/26/2014   ICD (implantable cardioverter-defibrillator) in place 03/18/2014   Chest pain, unspecified 08/09/2013   GERD (gastroesophageal reflux disease) 08/09/2013   NSVT (nonsustained ventricular tachycardia) 08/09/2013  Hypertrophic cardiomyopathy (HCC) 07/04/2013   Syncope 06/21/2012    Cory Kitt C. Lacharles Altschuler PT, DPT 03/16/21 2:21 PM   [facility] [address] [place], [place], [address] Phone: [phone]   Fax:  [phone]  Name: David Booker MRN: [id number] Date of Birth: 10/06/1975

## 2021-03-21 ENCOUNTER — Ambulatory Visit (INDEPENDENT_AMBULATORY_CARE_PROVIDER_SITE_OTHER): Payer: BC Managed Care – PPO

## 2021-03-21 DIAGNOSIS — I422 Other hypertrophic cardiomyopathy: Secondary | ICD-10-CM | POA: Diagnosis not present

## 2021-03-21 LAB — CUP PACEART REMOTE DEVICE CHECK
Battery Remaining Longevity: 31 mo
Battery Remaining Percentage: 30 %
Battery Voltage: 2.9 V
Brady Statistic AP VP Percent: 1 %
Brady Statistic AP VS Percent: 1 %
Brady Statistic AS VP Percent: 1 %
Brady Statistic AS VS Percent: 99 %
Brady Statistic RA Percent Paced: 1 %
Brady Statistic RV Percent Paced: 1 %
Date Time Interrogation Session: 20221109020016
HighPow Impedance: 81 Ohm
HighPow Impedance: 81 Ohm
Implantable Lead Implant Date: 20150625
Implantable Lead Implant Date: 20150625
Implantable Lead Location: 753859
Implantable Lead Location: 753860
Implantable Pulse Generator Implant Date: 20150625
Lead Channel Impedance Value: 410 Ohm
Lead Channel Impedance Value: 450 Ohm
Lead Channel Pacing Threshold Amplitude: 0.75 V
Lead Channel Pacing Threshold Amplitude: 1.25 V
Lead Channel Pacing Threshold Pulse Width: 0.5 ms
Lead Channel Pacing Threshold Pulse Width: 0.5 ms
Lead Channel Sensing Intrinsic Amplitude: 12 mV
Lead Channel Sensing Intrinsic Amplitude: 3.2 mV
Lead Channel Setting Pacing Amplitude: 2 V
Lead Channel Setting Pacing Amplitude: 2.5 V
Lead Channel Setting Pacing Pulse Width: 0.5 ms
Lead Channel Setting Sensing Sensitivity: 0.5 mV
Pulse Gen Serial Number: 7199672

## 2021-03-23 ENCOUNTER — Other Ambulatory Visit: Payer: Self-pay

## 2021-03-23 ENCOUNTER — Encounter (HOSPITAL_BASED_OUTPATIENT_CLINIC_OR_DEPARTMENT_OTHER): Payer: Self-pay | Admitting: Physical Therapy

## 2021-03-23 ENCOUNTER — Ambulatory Visit (HOSPITAL_BASED_OUTPATIENT_CLINIC_OR_DEPARTMENT_OTHER): Payer: BC Managed Care – PPO | Admitting: Physical Therapy

## 2021-03-23 DIAGNOSIS — G8929 Other chronic pain: Secondary | ICD-10-CM | POA: Diagnosis not present

## 2021-03-23 DIAGNOSIS — M25512 Pain in left shoulder: Secondary | ICD-10-CM

## 2021-03-23 NOTE — Therapy (Signed)
[facility] [address] [place], [place], [address] Phone: [phone]   Fax:  [phone]  Physical Therapy Treatment  Patient Details  Name: David Booker MRN: [id number] Date of Birth: Jul 08, 1975 Referring Provider (PT): [name]   Encounter Date: 03/23/2021   PT End of Session - 03/23/21 1103     Visit Number 15    Number of Visits 24    Date for PT Re-Evaluation 04/14/21    Authorization Type BCBS    Authorization - Number of Visits 30    PT Start Time 1100    PT Stop Time 1145    PT Time Calculation (min) 45 min    Activity Tolerance Patient tolerated treatment well    Behavior During Therapy [facility] for tasks assessed/performed             Past Medical History:  Diagnosis Date   Abnormal heart rhythm    Allergic rhinitis, cause unspecified    Anxiety    Back pain    without radiation   Bradycardia    Noted on event monitor (04/20/12-05/20/12) Just before bradycardia was recorded, HR was approx. 130bpm   Depressive disorder, not elsewhere classified    Esophageal reflux    Chronic but his current pain appears to be different than his radiating to the back. Possible biliary disease. (04/22/13 Dr. Carman Ching, GI)   Genital HSV    Heartburn    Long Hx   Near syncope    While wearing event monitor (04/20/12-05/20/12) 46bpm. Also had chest tightness at this time. Possible vagal [name] 05/22/12)   Other specified circulatory system disorders    Palpitations    PVC (premature ventricular contraction)    Noted on event monitor (04/20/12-05/20/12)    Substernal chest pain 04/22/13   Pt reports having vague symptoms of substernal CP, different from heartburn in that it is dull & radiatesto the back. Occurring at least 3 episodes over past several weeks. Pain free in between episodes. (04/22/13 Dr. Carman Ching GI)    Syncope    Syncope and collapse    Tachycardia, unspecified    Viral syndrome    Wide  QRS ventricular tachycardia    Noted on event monitor (04/20/12-05/20/12) at night    Past Surgical History:  Procedure Laterality Date   CARDIAC DEFIBRILLATOR PLACEMENT  11/04/13   SJM Fortify Assura DR implanted by Dr [name] for hypertrophic CM and syncope   IMPLANTABLE CARDIOVERTER DEFIBRILLATOR IMPLANT N/A 11/04/2013   Procedure: IMPLANTABLE CARDIOVERTER DEFIBRILLATOR IMPLANT;  Surgeon: Gardiner Rhyme, MD;  Location: [facility] CATH LAB;  Service: Cardiovascular;  Laterality: N/A;   SHOULDER SURGERY Right    Dislocated    There were no vitals filed for this visit.   Subjective Assessment - 03/23/21 1103     Subjective I aggrivated my shoulder. I was picking up items and steaming- no moment of injury, jsut started to hurt a lot after. I came really close to putting on a sling.    Patient Stated Goals driving, working, reaching    Currently in Pain? Yes    Pain Score 5     Pain Location Shoulder    Pain Descriptors / Indicators Aching                               [facility] Adult PT Treatment/Exercise - 03/23/21 0001       Modalities   Modalities Vasopneumatic  Vasopneumatic   Number Minutes Vasopneumatic  15 minutes    Vasopnuematic Location  Shoulder   Lt   Vasopneumatic Pressure Low    Vasopneumatic Temperature  34 deg      Manual Therapy   Soft tissue mobilization Lt upper trap, pec major/minor, subscap      Kinesiotix   Facilitate Muscle  Lt GHJ- ant&mid deltoid, post deltoid to line of supraspinatus, perpendicular to deltoid wrap                          PT Long Term Goals - 03/16/21 1320       PT LONG TERM GOAL #1   Title quick Dash to improve by MDC (16 points)    Baseline 52.3 at eval; 15.9 on 10/21    Status Achieved      PT LONG TERM GOAL #2   Title SPADI to change by MDC (21.5)    Baseline 51 at eval, 9 on 10/21    Status Achieved      PT LONG TERM GOAL #3   Title pt will be able to drive wihtout limitation by  shoulder pain    Baseline has made necessary changes    Status Achieved      PT LONG TERM GOAL #4   Title able to demo stable GHJ in MMT    Baseline achieved for straight plane testing at shoulder height    Status Achieved      PT LONG TERM GOAL #5   Title able to don/doff tshirt without notable instability    Baseline still feeling unstable but it does not hurt at this point    Status On-going      PT LONG TERM GOAL #6   Title resolution of back pain to avoid postural compensations affecting shoulder    Status Achieved      PT LONG TERM GOAL #7   Title able to return to bike riding wihtout limitation by shoulder    Baseline have not tried at this point    Status On-going                   Plan - 03/23/21 1237     Clinical Impression Statement Shoulder aggrivated today after holding clothing overhead to steam requiring lifting and moving around overhead. we have begun training coordinated motion overhead but have not yet addressed this motion in particular. Used vaso today for pain control and tape for support. Asked him to be gentle and aware of motion over the weekend and we should be able to continue progressing overhead stability.    PT Treatment/Interventions ADLs/Self Care Home Management;Cryotherapy;Moist Heat;Functional mobility training;Therapeutic activities;Therapeutic exercise;Neuromuscular re-education;Manual techniques;Patient/family education;Passive range of motion;Dry needling;Taping    PT Next Visit Plan cont overhead stability, Y liftoff without band, ball on wall overhead    PT Home Exercise Plan [id number]    Consulted and Agree with Plan of Care Patient             Patient will benefit from skilled therapeutic intervention in order to improve the following deficits and impairments:  Improper body mechanics, Postural dysfunction, Decreased activity tolerance, Hypermobility, Pain, Impaired UE functional use, Increased muscle spasms  Visit  Diagnosis: Chronic left shoulder pain     Problem List Patient Active Problem List   Diagnosis Date Noted   Shoulder impingement syndrome, left 10/17/2020   Soft tissue swelling 09/07/2020   Left shoulder pain 09/07/2020   Patellofemoral  pain syndrome of left knee 09/07/2020   Dermatochalasis of upper and lower eyelids of both eyes 05/27/2017   Activity, other specified 07/06/2015   Biliary colic 09/26/2014   Gallbladder sludge 09/26/2014   Other specified diseases of gallbladder 09/26/2014   ICD (implantable cardioverter-defibrillator) in place 03/18/2014   Chest pain, unspecified 08/09/2013   GERD (gastroesophageal reflux disease) 08/09/2013   NSVT (nonsustained ventricular tachycardia) 08/09/2013   Hypertrophic cardiomyopathy (HCC) 07/04/2013   Syncope 06/21/2012    Ajanee Buren C. Leontyne Manville PT, DPT 03/23/21 12:40 PM   [facility] [address] [place], [place], [address] Phone: [phone]   Fax:  [phone]  Name: Tristian Sickinger MRN: [id number] Date of Birth: 01-Dec-1975

## 2021-03-26 ENCOUNTER — Other Ambulatory Visit: Payer: Self-pay

## 2021-03-26 ENCOUNTER — Encounter (HOSPITAL_BASED_OUTPATIENT_CLINIC_OR_DEPARTMENT_OTHER): Payer: Self-pay | Admitting: Physical Therapy

## 2021-03-26 ENCOUNTER — Ambulatory Visit (HOSPITAL_BASED_OUTPATIENT_CLINIC_OR_DEPARTMENT_OTHER): Payer: BC Managed Care – PPO | Admitting: Physical Therapy

## 2021-03-26 DIAGNOSIS — G8929 Other chronic pain: Secondary | ICD-10-CM | POA: Diagnosis not present

## 2021-03-26 DIAGNOSIS — M25512 Pain in left shoulder: Secondary | ICD-10-CM | POA: Diagnosis not present

## 2021-03-26 NOTE — Therapy (Signed)
[facility] [address] [place], [place], [address] Phone: [phone]   Fax:  [phone]  Physical Therapy Treatment  Patient Details  Name: David Booker MRN: [id number] Date of Birth: [birth year] Referring Provider (PT): [name]   Encounter Date: 03/26/2021   PT End of Session - 03/26/21 1017     Visit Number 16    Number of Visits 24    Date for PT Re-Evaluation 04/14/21    Authorization Type BCBS    Authorization - Number of Visits 30    PT Start Time 1016    PT Stop Time 1058    PT Time Calculation (min) 42 min    Activity Tolerance Patient tolerated treatment well    Behavior During Therapy [facility] for tasks assessed/performed             Past Medical History:  Diagnosis Date   Abnormal heart rhythm    Allergic rhinitis, cause unspecified    Anxiety    Back pain    without radiation   Bradycardia    Noted on event monitor (04/20/12-05/20/12) Just before bradycardia was recorded, HR was approx. 130bpm   Depressive disorder, not elsewhere classified    Esophageal reflux    Chronic but his current pain appears to be different than his radiating to the back. Possible biliary disease. (04/22/13 Dr. Carman Ching, GI)   Genital HSV    Heartburn    Long Hx   Near syncope    While wearing event monitor (04/20/12-05/20/12) 46bpm. Also had chest tightness at this time. Possible vagal [name] 05/22/12)   Other specified circulatory system disorders    Palpitations    PVC (premature ventricular contraction)    Noted on event monitor (04/20/12-05/20/12)    Substernal chest pain 04/22/13   Pt reports having vague symptoms of substernal CP, different from heartburn in that it is dull & radiatesto the back. Occurring at least 3 episodes over past several weeks. Pain free in between episodes. (04/22/13 Dr. Carman Ching GI)    Syncope    Syncope and collapse    Tachycardia, unspecified    Viral syndrome    Wide  QRS ventricular tachycardia    Noted on event monitor (04/20/12-05/20/12) at night    Past Surgical History:  Procedure Laterality Date   CARDIAC DEFIBRILLATOR PLACEMENT  11/04/13   SJM Fortify Assura DR implanted by Dr [name] for hypertrophic CM and syncope   IMPLANTABLE CARDIOVERTER DEFIBRILLATOR IMPLANT N/A 11/04/2013   Procedure: IMPLANTABLE CARDIOVERTER DEFIBRILLATOR IMPLANT;  Surgeon: Gardiner Rhyme, MD;  Location: [facility] CATH LAB;  Service: Cardiovascular;  Laterality: N/A;   SHOULDER SURGERY Right    Dislocated    There were no vitals filed for this visit.   Subjective Assessment - 03/26/21 1018     Subjective got back to normal quickly. feeling good today.    Patient Stated Goals driving, working, reaching                [facility] PT Assessment - 03/26/21 0001       Strength   Overall Strength Comments difficulty with GHJ depression at 120 deg transition through flexion                           [facility] Adult PT Treatment/Exercise - 03/26/21 0001       Shoulder Exercises: Standing   Other Standing Exercises D1 & 2 flx & ext, plyo push ups, lateral push  ups, large range ABC 2lb weight, Y liftoff 2lb, anchored ER/IR + overhead press, overhead ball toss at wall                          PT Long Term Goals - 03/16/21 1320       PT LONG TERM GOAL #1   Title quick Dash to improve by MDC (16 points)    Baseline 52.3 at eval; 15.9 on 10/21    Status Achieved      PT LONG TERM GOAL #2   Title SPADI to change by MDC (21.5)    Baseline 51 at eval, 9 on 10/21    Status Achieved      PT LONG TERM GOAL #3   Title pt will be able to drive wihtout limitation by shoulder pain    Baseline has made necessary changes    Status Achieved      PT LONG TERM GOAL #4   Title able to demo stable GHJ in MMT    Baseline achieved for straight plane testing at shoulder height    Status Achieved      PT LONG TERM GOAL #5   Title able to don/doff tshirt  without notable instability    Baseline still feeling unstable but it does not hurt at this point    Status On-going      PT LONG TERM GOAL #6   Title resolution of back pain to avoid postural compensations affecting shoulder    Status Achieved      PT LONG TERM GOAL #7   Title able to return to bike riding wihtout limitation by shoulder    Baseline have not tried at this point    Status On-going                   Plan - 03/26/21 1059     Clinical Impression Statement progressed HEP for long term challenges with quick review of each exercise form to ensure understanding.Unsure of continuing POC due to financial reasons and will let us know once he talks to insurance. Overall his awareness of shoulder control is significantly improved, tactile cues are not required as often as they were and can achieve proper posture with mostly verbal cuing. progressed exercises focused on overhead and large range motions with need to create depression of GH head to decrease shift. could feel fatigue after exercises today rather than pain as he did with steaming clothing.    PT Treatment/Interventions ADLs/Self Care Home Management;Cryotherapy;Moist Heat;Functional mobility training;Therapeutic activities;Therapeutic exercise;Neuromuscular re-education;Manual techniques;Patient/family education;Passive range of motion;Dry needling;Taping    PT Next Visit Plan continue overhead stability or d/c if insurance not appropriate    PT Home Exercise Plan [id number]    Consulted and Agree with Plan of Care Patient             Patient will benefit from skilled therapeutic intervention in order to improve the following deficits and impairments:  Improper body mechanics, Postural dysfunction, Decreased activity tolerance, Hypermobility, Pain, Impaired UE functional use, Increased muscle spasms  Visit Diagnosis: Chronic left shoulder pain     Problem List Patient Active Problem List   Diagnosis Date  Noted   Shoulder impingement syndrome, left 10/17/2020   Soft tissue swelling 09/07/2020   Left shoulder pain 09/07/2020   Patellofemoral pain syndrome of left knee 09/07/2020   Dermatochalasis of upper and lower eyelids of both eyes 05/27/2017   Activity, other specified 07/06/2015  Biliary colic 09/26/2014   Gallbladder sludge 09/26/2014   Other specified diseases of gallbladder 09/26/2014   ICD (implantable cardioverter-defibrillator) in place 03/18/2014   Chest pain, unspecified 08/09/2013   GERD (gastroesophageal reflux disease) 08/09/2013   NSVT (nonsustained ventricular tachycardia) 08/09/2013   Hypertrophic cardiomyopathy (HCC) 07/04/2013   Syncope 06/21/2012   Sy Saintjean C. Gavino Fouch PT, DPT 03/26/21 12:41 PM  [facility] [address] [place], [place], [address] Phone: [phone]   Fax:  [phone]  Name: Trevino Wyatt MRN: [id number] Date of Birth: Dec 04, 1975

## 2021-03-30 ENCOUNTER — Ambulatory Visit (HOSPITAL_BASED_OUTPATIENT_CLINIC_OR_DEPARTMENT_OTHER): Payer: BC Managed Care – PPO | Admitting: Physical Therapy

## 2021-03-30 NOTE — Progress Notes (Signed)
Remote ICD transmission.   

## 2021-04-03 ENCOUNTER — Ambulatory Visit (HOSPITAL_BASED_OUTPATIENT_CLINIC_OR_DEPARTMENT_OTHER): Payer: BC Managed Care – PPO | Admitting: Physical Therapy

## 2021-04-13 ENCOUNTER — Encounter (HOSPITAL_BASED_OUTPATIENT_CLINIC_OR_DEPARTMENT_OTHER): Payer: BC Managed Care – PPO | Admitting: Physical Therapy

## 2021-04-18 ENCOUNTER — Encounter (HOSPITAL_BASED_OUTPATIENT_CLINIC_OR_DEPARTMENT_OTHER): Payer: Self-pay | Admitting: Family Medicine

## 2021-04-18 ENCOUNTER — Other Ambulatory Visit: Payer: Self-pay

## 2021-04-18 ENCOUNTER — Ambulatory Visit (HOSPITAL_BASED_OUTPATIENT_CLINIC_OR_DEPARTMENT_OTHER): Payer: BC Managed Care – PPO | Admitting: Family Medicine

## 2021-04-18 VITALS — BP 120/82 | HR 71 | Ht 72.0 in | Wt 196.0 lb

## 2021-04-18 DIAGNOSIS — F419 Anxiety disorder, unspecified: Secondary | ICD-10-CM

## 2021-04-18 DIAGNOSIS — F32A Depression, unspecified: Secondary | ICD-10-CM | POA: Diagnosis not present

## 2021-04-18 DIAGNOSIS — K219 Gastro-esophageal reflux disease without esophagitis: Secondary | ICD-10-CM

## 2021-04-18 DIAGNOSIS — M7542 Impingement syndrome of left shoulder: Secondary | ICD-10-CM

## 2021-04-18 MED ORDER — FLUOXETINE HCL 20 MG PO CAPS: 20.0000 mg | ORAL_CAPSULE | Freq: Every day | ORAL | 1 refills | Status: DC

## 2021-04-18 MED ORDER — METOPROLOL SUCCINATE ER 100 MG PO TB24: 100.0000 mg | ORAL_TABLET | Freq: Every day | ORAL | 1 refills | Status: DC

## 2021-04-18 MED ORDER — BUPROPION HCL ER (XL) 150 MG PO TB24: 150.0000 mg | ORAL_TABLET | Freq: Every day | ORAL | 1 refills | Status: DC

## 2021-04-18 MED ORDER — VALACYCLOVIR HCL 500 MG PO TABS: 500.0000 mg | ORAL_TABLET | Freq: Every day | ORAL | 1 refills | Status: DC

## 2021-04-18 NOTE — Patient Instructions (Addendum)
  Medication Instructions:  Your physician recommends that you continue on your current medications as directed. Please refer to the Current Medication list given to you today. --If you need a refill on any your medications before your next appointment, please call your pharmacy first. If no refills are authorized on file call the office.-- Follow-Up: Your next appointment:   Your physician recommends that you schedule a follow-up appointment in: 6 MONTHS with Dr. de [place]  You will receive a text message or e-mail with a link to a survey about your care and experience with us today! We would greatly appreciate your feedback!   Thanks for letting us be apart of your health journey!!  Primary Care and Sports Medicine   Dr. Raymond de [place]   We encourage you to activate your patient portal called "MyChart".  Sign up information is provided on this After Visit Summary.  MyChart is used to connect with patients for Virtual Visits (Telemedicine).  Patients are able to view lab/test results, encounter notes, upcoming appointments, etc.  Non-urgent messages can be sent to your provider as well. To learn more about what you can do with MyChart, please visit --  [email or web address].    

## 2021-04-18 NOTE — Progress Notes (Signed)
    Procedures performed today:    None.  Independent interpretation of notes and tests performed by another provider:   None.  Brief History, Exam, Impression, and Recommendations:    BP 120/82   Pulse 71   Ht 6' (1.829 m)   Wt 196 lb (88.9 kg)   SpO2 99%   BMI 26.58 kg/m   GERD (gastroesophageal reflux disease) Reports that he was taking omeprazole in the past, however he had weaned himself off of the omeprazole due to concerns regarding long-term use of PPI Reports that he has been doing well since weaning and has primarily been managed with lifestyle modifications, has not had any increase in symptoms recently over the holiday Plan to continue with lifestyle modifications, monitoring for any recurrence of symptoms  Anxiety and depression Has been doing well with management with Wellbutrin and Prozac, requesting refill today Patient stable on current regimen, will refill medications as requested  Shoulder impingement syndrome, left Has been doing well with physical therapy, overall feels about 60% improved from when pain first began Reports that he has been doing home exercise program as recommended by physical therapy Still will have flareups in pain with certain activities, most notably with overhead lifting Discussed options with patient, given that he continues to have general improvement and any flares in acute pain are not significant, patient wishes to hold off on injection at this time which is reasonable Recommend continuing with physical therapy, continue with home exercise program Continue to monitor moving forward, if pain worsens or patient would like to proceed with subacromial injection, this can be completed as desired at future visit  Plan for follow-up in about 6 months for CPE, labs to be completed at a time   ___________________________________________ Maximillion Gill [name], MD, ABFM, CAQSM Primary Care and Sports Medicine [facility]

## 2021-04-18 NOTE — Assessment & Plan Note (Signed)
Has been doing well with physical therapy, overall feels about 60% improved from when pain first began Reports that he has been doing home exercise program as recommended by physical therapy Still will have flareups in pain with certain activities, most notably with overhead lifting Discussed options with patient, given that he continues to have general improvement and any flares in acute pain are not significant, patient wishes to hold off on injection at this time which is reasonable Recommend continuing with physical therapy, continue with home exercise program Continue to monitor moving forward, if pain worsens or patient would like to proceed with subacromial injection, this can be completed as desired at future visit

## 2021-04-18 NOTE — Assessment & Plan Note (Signed)
Reports that he was taking omeprazole in the past, however he had weaned himself off of the omeprazole due to concerns regarding long-term use of PPI Reports that he has been doing well since weaning and has primarily been managed with lifestyle modifications, has not had any increase in symptoms recently over the holiday Plan to continue with lifestyle modifications, monitoring for any recurrence of symptoms

## 2021-04-18 NOTE — Assessment & Plan Note (Signed)
Has been doing well with management with Wellbutrin and Prozac, requesting refill today Patient stable on current regimen, will refill medications as requested

## 2021-04-20 ENCOUNTER — Encounter (HOSPITAL_BASED_OUTPATIENT_CLINIC_OR_DEPARTMENT_OTHER): Payer: BC Managed Care – PPO | Admitting: Physical Therapy

## 2021-04-27 ENCOUNTER — Other Ambulatory Visit: Payer: Self-pay

## 2021-04-27 ENCOUNTER — Ambulatory Visit (INDEPENDENT_AMBULATORY_CARE_PROVIDER_SITE_OTHER): Payer: BC Managed Care – PPO | Admitting: Internal Medicine

## 2021-04-27 ENCOUNTER — Encounter (HOSPITAL_BASED_OUTPATIENT_CLINIC_OR_DEPARTMENT_OTHER): Payer: Self-pay | Admitting: Internal Medicine

## 2021-04-27 VITALS — BP 120/72 | HR 85 | Ht 72.0 in | Wt 201.0 lb

## 2021-04-27 DIAGNOSIS — Z87898 Personal history of other specified conditions: Secondary | ICD-10-CM | POA: Diagnosis not present

## 2021-04-27 DIAGNOSIS — I422 Other hypertrophic cardiomyopathy: Secondary | ICD-10-CM

## 2021-04-27 NOTE — Patient Instructions (Addendum)
Medication Instructions:  Your physician recommends that you continue on your current medications as directed. Please refer to the Current Medication list given to you today. *If you need a refill on your cardiac medications before your next appointment, please call your pharmacy*  Lab Work: CBC, BMP, TSH, MAG If you have labs (blood work) drawn today and your tests are completely normal, you will receive your results only by: MyChart Message (if you have MyChart) OR A paper copy in the mail If you have any lab test that is abnormal or we need to change your treatment, we will call you to review the results.  Testing/Procedures: Your physician has requested that you have an echocardiogram. Echocardiography is a painless test that uses sound waves to create images of your heart. It provides your doctor with information about the size and shape of your heart and how well your hearts chambers and valves are working. This procedure takes approximately one hour. There are no restrictions for this procedure.   Follow-Up: At [facility], you and your health needs are our priority.  As part of our continuing mission to provide you with exceptional heart care, we have created designated Provider Care Teams.  These Care Teams include your primary Cardiologist (physician) and Advanced Practice Providers (APPs -  Physician Assistants and Nurse Practitioners) who all work together to provide you with the care you need, when you need it.  Your physician wants you to follow-up in: 12 months with  Hillis Range, MD     You will receive a reminder letter in the mail two months in advance. If you don't receive a letter, please call our office to schedule the follow-up appointment.  Remote monitoring is used to monitor your ICD from home. This monitoring reduces the number of office visits required to check your device to one time per year. It allows [place] to keep an eye on the functioning of your device to ensure  it is working properly. You are scheduled for a device check from home on 06/20/21. You may send your transmission at any time that day. If you have a wireless device, the transmission will be sent automatically. After your physician reviews your transmission, you will receive a postcard with your next transmission date.  We recommend signing up for the patient portal called "MyChart".  Sign up information is provided on this After Visit Summary.  MyChart is used to connect with patients for Virtual Visits (Telemedicine).  Patients are able to view lab/test results, encounter notes, upcoming appointments, etc.  Non-urgent messages can be sent to your provider as well.   To learn more about what you can do with MyChart, go to [email or web address].    Any Other Special Instructions Will Be Listed Below (If Applicable).

## 2021-04-27 NOTE — Progress Notes (Signed)
PCP: [name], [name], MD Primary Cardiologist: Dr [name],  has also seen Dr Regino Schultze at [facility] Primary EP: Dr [name]  David Booker is a 45 y.o. male who presents today for routine electrophysiology followup.  Since last being seen in our clinic, the patient reports doing reasonably well.  He is active.  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  I have not seen him since 2019.  Today, he denies symptoms of palpitations, chest pain, shortness of breath,  lower extremity edema, syncope, or ICD shocks.  He has rare postural dizziness with occasional presyncope.  He has excellent hydration, has liberalized sodium intake and avoids caffeine/ ETOH.  The patient is otherwise without complaint today.   Past Medical History:  Diagnosis Date   Abnormal heart rhythm    Allergic rhinitis, cause unspecified    Anxiety    Back pain    without radiation   Bradycardia    Noted on event monitor (04/20/12-05/20/12) Just before bradycardia was recorded, HR was approx. 130bpm   Depressive disorder, not elsewhere classified    Esophageal reflux    Chronic but his current pain appears to be different than his radiating to the back. Possible biliary disease. (04/22/13 Dr. Carman Ching, GI)   Genital HSV    Heartburn    Long Hx   Near syncope    While wearing event monitor (04/20/12-05/20/12) 46bpm. Also had chest tightness at this time. Possible vagal [name] 05/22/12)   Other specified circulatory system disorders    Palpitations    PVC (premature ventricular contraction)    Noted on event monitor (04/20/12-05/20/12)    Substernal chest pain 04/22/13   Pt reports having vague symptoms of substernal CP, different from heartburn in that it is dull & radiatesto the back. Occurring at least 3 episodes over past several weeks. Pain free in between episodes. (04/22/13 Dr. Carman Ching GI)    Syncope    Syncope and collapse    Tachycardia, unspecified    Viral syndrome    Wide QRS ventricular  tachycardia    Noted on event monitor (04/20/12-05/20/12) at night   Past Surgical History:  Procedure Laterality Date   CARDIAC DEFIBRILLATOR PLACEMENT  11/04/13   SJM Fortify Assura DR implanted by Dr [name] for hypertrophic CM and syncope   IMPLANTABLE CARDIOVERTER DEFIBRILLATOR IMPLANT N/A 11/04/2013   Procedure: IMPLANTABLE CARDIOVERTER DEFIBRILLATOR IMPLANT;  Surgeon: Gardiner Rhyme, MD;  Location: [facility] CATH LAB;  Service: Cardiovascular;  Laterality: N/A;   SHOULDER SURGERY Right    Dislocated    ROS- all systems are reviewed and negative except as per HPI above  Current Outpatient Medications  Medication Sig Dispense Refill   ALPRAZolam (XANAX) 0.5 MG tablet Take 1 tablet (0.5 mg total) by mouth daily as needed for anxiety. 15 tablet 0   buPROPion (WELLBUTRIN XL) 150 MG 24 hr tablet Take 1 tablet (150 mg total) by mouth daily with lunch. 90 tablet 1   FLUoxetine (PROZAC) 20 MG capsule Take 1 capsule (20 mg total) by mouth daily. 90 capsule 1   metoprolol succinate (TOPROL-XL) 100 MG 24 hr tablet Take 1 tablet (100 mg total) by mouth daily. Take with or immediately following a meal. 90 tablet 1   valACYclovir (VALTREX) 500 MG tablet Take 1 tablet (500 mg total) by mouth daily. 90 tablet 1   No current facility-administered medications for this visit.    Physical Exam: Vitals:   04/27/21 1507  BP: 120/72  Pulse: 85  SpO2: 96%  Weight: 201 lb (91.2 kg)  Height: 6' (1.829 m)    GEN- The patient is well appearing, alert and oriented x 3 today.   Head- normocephalic, atraumatic Eyes-  Sclera clear, conjunctiva pink Ears- hearing intact Oropharynx- clear Lungs- Clear to ausculation bilaterally, normal work of breathing Chest- ICD pocket is well healed Heart- Regular rate and rhythm, 2/6 diastolic murmur, high pitched at the apex GI- soft, NT, ND, + BS Extremities- no clubbing, cyanosis, or edema  ICD interrogation- reviewed in detail today,  See PACEART report  ekg  tracing ordered today is personally reviewed and shows sinus rhythm 85 bpm, PR 204 msec, QRS 90 msec, QTc 433 msec  Wt Readings from Last 3 Encounters:  04/27/21 201 lb (91.2 kg)  04/18/21 196 lb (88.9 kg)  02/05/21 192 lb (87.1 kg)    Assessment and Plan:  1.  Hypertrophic CM with prior syncope Stable on an appropriate medical regimen Normal ICD function See Pace Art report No changes today he is not device dependant today Given worsening postural dizziness, will obtain cbc, bmet, mg and tsh. Echo with valsalva to evaluate HCM gradient.  I have advised that he re-establish with Dr Regino Schultze at [facility].   Return to see me in a year  Hillis Range MD, [facility] 04/27/2021 3:13 PM

## 2021-05-16 ENCOUNTER — Ambulatory Visit (HOSPITAL_BASED_OUTPATIENT_CLINIC_OR_DEPARTMENT_OTHER): Payer: BC Managed Care – PPO | Admitting: Family Medicine

## 2021-05-17 ENCOUNTER — Ambulatory Visit (HOSPITAL_COMMUNITY): Payer: BC Managed Care – PPO | Attending: Internal Medicine

## 2021-05-17 ENCOUNTER — Other Ambulatory Visit: Payer: BC Managed Care – PPO | Admitting: *Deleted

## 2021-05-17 ENCOUNTER — Other Ambulatory Visit: Payer: Self-pay

## 2021-05-17 DIAGNOSIS — Z87898 Personal history of other specified conditions: Secondary | ICD-10-CM | POA: Diagnosis not present

## 2021-05-17 DIAGNOSIS — I422 Other hypertrophic cardiomyopathy: Secondary | ICD-10-CM | POA: Diagnosis not present

## 2021-05-17 LAB — ECHOCARDIOGRAM COMPLETE
Area-P 1/2: 3.26 cm2
S' Lateral: 2 cm

## 2021-05-18 LAB — BASIC METABOLIC PANEL
BUN/Creatinine Ratio: 12 (ref 9–20)
BUN: 13 mg/dL (ref 6–24)
CO2: 24 mmol/L (ref 20–29)
Calcium: 9.2 mg/dL (ref 8.7–10.2)
Chloride: 102 mmol/L (ref 96–106)
Creatinine, Ser: 1.08 mg/dL (ref 0.76–1.27)
Glucose: 103 mg/dL — ABNORMAL HIGH (ref 70–99)
Potassium: 4.2 mmol/L (ref 3.5–5.2)
Sodium: 142 mmol/L (ref 134–144)
eGFR: 85 mL/min/{1.73_m2} (ref >59)

## 2021-05-18 LAB — MAGNESIUM: Magnesium: 2 mg/dL (ref 1.6–2.3)

## 2021-05-18 LAB — CBC
Hematocrit: 43.5 % (ref 37.5–51.0)
Hemoglobin: 15 g/dL (ref 13.0–17.7)
MCH: 28.8 pg (ref 26.6–33.0)
MCHC: 34.5 g/dL (ref 31.5–35.7)
MCV: 84 fL (ref 79–97)
Platelets: 282 10*3/uL (ref 150–450)
RBC: 5.21 x10E6/uL (ref 4.14–5.80)
RDW: 12.7 % (ref 11.6–15.4)
WBC: 7.4 10*3/uL (ref 3.4–10.8)

## 2021-05-18 LAB — TSH: TSH: 2.25 u[IU]/mL (ref 0.450–4.500)

## 2021-06-20 ENCOUNTER — Ambulatory Visit (INDEPENDENT_AMBULATORY_CARE_PROVIDER_SITE_OTHER): Payer: BC Managed Care – PPO

## 2021-06-20 DIAGNOSIS — I422 Other hypertrophic cardiomyopathy: Secondary | ICD-10-CM | POA: Diagnosis not present

## 2021-06-20 LAB — CUP PACEART REMOTE DEVICE CHECK
Battery Remaining Longevity: 32 mo
Battery Remaining Percentage: 31 %
Battery Voltage: 2.87 V
Brady Statistic AP VP Percent: 1 %
Brady Statistic AP VS Percent: 1 %
Brady Statistic AS VP Percent: 1 %
Brady Statistic AS VS Percent: 99 %
Brady Statistic RA Percent Paced: 1 %
Brady Statistic RV Percent Paced: 1 %
Date Time Interrogation Session: 20230208020028
HighPow Impedance: 72 Ohm
HighPow Impedance: 72 Ohm
Implantable Lead Implant Date: 20150625
Implantable Lead Implant Date: 20150625
Implantable Lead Location: 753859
Implantable Lead Location: 753860
Implantable Pulse Generator Implant Date: 20150625
Lead Channel Impedance Value: 380 Ohm
Lead Channel Impedance Value: 430 Ohm
Lead Channel Pacing Threshold Amplitude: 0.75 V
Lead Channel Pacing Threshold Amplitude: 1 V
Lead Channel Pacing Threshold Pulse Width: 0.5 ms
Lead Channel Pacing Threshold Pulse Width: 0.6 ms
Lead Channel Sensing Intrinsic Amplitude: 11.4 mV
Lead Channel Sensing Intrinsic Amplitude: 3.4 mV
Lead Channel Setting Pacing Amplitude: 2 V
Lead Channel Setting Pacing Amplitude: 2.5 V
Lead Channel Setting Pacing Pulse Width: 0.5 ms
Lead Channel Setting Sensing Sensitivity: 0.5 mV
Pulse Gen Serial Number: 7199672

## 2021-06-25 NOTE — Progress Notes (Signed)
Remote ICD transmission.   

## 2021-09-19 ENCOUNTER — Ambulatory Visit (INDEPENDENT_AMBULATORY_CARE_PROVIDER_SITE_OTHER): Payer: BC Managed Care – PPO

## 2021-09-19 DIAGNOSIS — I422 Other hypertrophic cardiomyopathy: Secondary | ICD-10-CM

## 2021-09-20 LAB — CUP PACEART REMOTE DEVICE CHECK
Battery Remaining Longevity: 30 mo
Battery Remaining Percentage: 29 %
Battery Voltage: 2.87 V
Brady Statistic AP VP Percent: 1 %
Brady Statistic AP VS Percent: 1 %
Brady Statistic AS VP Percent: 1 %
Brady Statistic AS VS Percent: 99 %
Brady Statistic RA Percent Paced: 1 %
Brady Statistic RV Percent Paced: 1 %
Date Time Interrogation Session: 20230510061001
HighPow Impedance: 74 Ohm
HighPow Impedance: 74 Ohm
Implantable Lead Implant Date: 20150625
Implantable Lead Implant Date: 20150625
Implantable Lead Location: 753859
Implantable Lead Location: 753860
Implantable Pulse Generator Implant Date: 20150625
Lead Channel Impedance Value: 410 Ohm
Lead Channel Impedance Value: 450 Ohm
Lead Channel Pacing Threshold Amplitude: 0.75 V
Lead Channel Pacing Threshold Amplitude: 1 V
Lead Channel Pacing Threshold Pulse Width: 0.5 ms
Lead Channel Pacing Threshold Pulse Width: 0.6 ms
Lead Channel Sensing Intrinsic Amplitude: 12 mV
Lead Channel Sensing Intrinsic Amplitude: 3 mV
Lead Channel Setting Pacing Amplitude: 2 V
Lead Channel Setting Pacing Amplitude: 2.5 V
Lead Channel Setting Pacing Pulse Width: 0.5 ms
Lead Channel Setting Sensing Sensitivity: 0.5 mV
Pulse Gen Serial Number: 7199672

## 2021-09-28 NOTE — Progress Notes (Signed)
Remote ICD transmission.   

## 2021-10-17 ENCOUNTER — Ambulatory Visit (INDEPENDENT_AMBULATORY_CARE_PROVIDER_SITE_OTHER): Payer: BC Managed Care – PPO

## 2021-10-17 ENCOUNTER — Ambulatory Visit (HOSPITAL_BASED_OUTPATIENT_CLINIC_OR_DEPARTMENT_OTHER): Payer: BC Managed Care – PPO | Admitting: Family Medicine

## 2021-10-17 ENCOUNTER — Encounter (HOSPITAL_BASED_OUTPATIENT_CLINIC_OR_DEPARTMENT_OTHER): Payer: Self-pay | Admitting: Family Medicine

## 2021-10-17 ENCOUNTER — Encounter (HOSPITAL_BASED_OUTPATIENT_CLINIC_OR_DEPARTMENT_OTHER): Payer: Self-pay | Admitting: Internal Medicine

## 2021-10-17 VITALS — BP 117/81 | HR 74 | Ht 73.0 in | Wt 192.6 lb

## 2021-10-17 DIAGNOSIS — M25512 Pain in left shoulder: Secondary | ICD-10-CM

## 2021-10-17 DIAGNOSIS — S4992XA Unspecified injury of left shoulder and upper arm, initial encounter: Secondary | ICD-10-CM | POA: Diagnosis not present
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
## 2021-10-18 NOTE — Telephone Encounter (Signed)
Please advise 

## 2021-10-19 NOTE — Progress Notes (Signed)
LVM for pt to cb to schedule this follow-up in about 6 to 8 weeks.

## 2021-10-23 NOTE — Telephone Encounter (Signed)
LVM for patient to call device clinic back direct number [phone] provided.

## 2021-10-29 ENCOUNTER — Encounter (HOSPITAL_BASED_OUTPATIENT_CLINIC_OR_DEPARTMENT_OTHER): Payer: Self-pay | Admitting: Family Medicine

## 2021-10-29 DIAGNOSIS — M25512 Pain in left shoulder: Secondary | ICD-10-CM

## 2021-11-01 ENCOUNTER — Ambulatory Visit (HOSPITAL_BASED_OUTPATIENT_CLINIC_OR_DEPARTMENT_OTHER): Payer: BC Managed Care – PPO | Attending: Family Medicine | Admitting: Physical Therapy

## 2021-11-01 ENCOUNTER — Encounter (HOSPITAL_BASED_OUTPATIENT_CLINIC_OR_DEPARTMENT_OTHER): Payer: Self-pay | Admitting: Physical Therapy

## 2021-11-01 DIAGNOSIS — M25512 Pain in left shoulder: Secondary | ICD-10-CM

## 2021-11-01 DIAGNOSIS — M25612 Stiffness of left shoulder, not elsewhere classified: Secondary | ICD-10-CM | POA: Diagnosis not present
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
## 2021-11-13 ENCOUNTER — Other Ambulatory Visit (HOSPITAL_BASED_OUTPATIENT_CLINIC_OR_DEPARTMENT_OTHER): Payer: Self-pay | Admitting: Family Medicine

## 2021-11-19 ENCOUNTER — Ambulatory Visit (HOSPITAL_BASED_OUTPATIENT_CLINIC_OR_DEPARTMENT_OTHER): Payer: BC Managed Care – PPO | Attending: Family Medicine | Admitting: Physical Therapy

## 2021-11-19 ENCOUNTER — Encounter (HOSPITAL_BASED_OUTPATIENT_CLINIC_OR_DEPARTMENT_OTHER): Payer: Self-pay | Admitting: Physical Therapy

## 2021-11-19 DIAGNOSIS — M25512 Pain in left shoulder: Secondary | ICD-10-CM | POA: Insufficient documentation

## 2021-11-19 DIAGNOSIS — M25612 Stiffness of left shoulder, not elsewhere classified: Secondary | ICD-10-CM | POA: Diagnosis not present

## 2021-11-19 DIAGNOSIS — G8929 Other chronic pain: Secondary | ICD-10-CM | POA: Insufficient documentation
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
## 2021-11-26 DIAGNOSIS — I421 Obstructive hypertrophic cardiomyopathy: Secondary | ICD-10-CM | POA: Diagnosis not present

## 2021-12-04 ENCOUNTER — Encounter (HOSPITAL_BASED_OUTPATIENT_CLINIC_OR_DEPARTMENT_OTHER): Payer: Self-pay | Admitting: Physical Therapy

## 2021-12-06 ENCOUNTER — Telehealth: Payer: Self-pay | Admitting: Internal Medicine

## 2021-12-06 ENCOUNTER — Encounter (HOSPITAL_BASED_OUTPATIENT_CLINIC_OR_DEPARTMENT_OTHER): Payer: Self-pay | Admitting: Family Medicine

## 2021-12-06 NOTE — Telephone Encounter (Signed)
Spoke with patient by phone. He states he was started on Norpace 100 mg TID by Dr. Regino Schultze at [facility]. Dr. Regino Schultze recommended a follow up EKG locally. Advised patient that he should contact his primary care doctor for this as he is no longer being followed by Dr. [name] in our office. Patient verbalized understanding and will follow up with his PCP.

## 2021-12-06 NOTE — Telephone Encounter (Signed)
Pt states that another provider ( Dr. Regino Schultze) recommended that he has an EKG done. Pt would like to know if an order can be put in for this as well as a callback. Please advise

## 2021-12-31 ENCOUNTER — Ambulatory Visit (INDEPENDENT_AMBULATORY_CARE_PROVIDER_SITE_OTHER): Payer: BC Managed Care – PPO | Admitting: Family Medicine

## 2021-12-31 ENCOUNTER — Encounter (HOSPITAL_BASED_OUTPATIENT_CLINIC_OR_DEPARTMENT_OTHER): Payer: Self-pay | Admitting: Family Medicine

## 2021-12-31 VITALS — BP 107/72 | HR 73 | Ht 73.0 in | Wt 189.6 lb

## 2021-12-31 DIAGNOSIS — I422 Other hypertrophic cardiomyopathy: Secondary | ICD-10-CM

## 2021-12-31 NOTE — Assessment & Plan Note (Signed)
Patient recently reestablished with his cardiologist at [facility], Dr. Regino Schultze.  He was recommended to do so by his cardiologist locally due to patient having some symptoms related to postural dizziness.  Dr. Regino Schultze started patient on a new medication, Norpace.  He reports that he has been tolerating this medication well.  Is not aware of any new side effects or symptoms, generally has been feeling better being on this medication.  He was advised he would need to have an EKG for monitoring after starting this new medication and was told by Dr. Regino Schultze that it would be fine to have this done by a provider locally.  He thus presents today to have an EKG completed for monitoring of new medication. On exam, patient is in no acute distress, vital signs are stable, blood pressure normal, pulse normal. EKG completed today which is generally reassuring, normal rhythm, normal rate, normal QTc. Discussed with patient that EKG is generally reassuring, we will fax EKG results and office note to his cardiologist at [facility] for them to review and advise patient if any next steps are needed.

## 2021-12-31 NOTE — Progress Notes (Signed)
    Procedures performed today:    None.  Independent interpretation of notes and tests performed by another provider:   None.  Brief History, Exam, Impression, and Recommendations:    BP 107/72   Pulse 73   Ht 6\' 1"  (1.854 m)   Wt 189 lb 9.6 oz (86 kg)   SpO2 100%   BMI 25.01 kg/m   Hypertrophic cardiomyopathy Patient recently reestablished with his cardiologist at [facility], Dr. [facility].  He was recommended to do so by his cardiologist locally due to patient having some symptoms related to postural dizziness.  Dr. Regino Schultze started patient on a new medication, Norpace.  He reports that he has been tolerating this medication well.  Is not aware of any new side effects or symptoms, generally has been feeling better being on this medication.  He was advised he would need to have an EKG for monitoring after starting this new medication and was told by Dr. Regino Schultze that it would be fine to have this done by a provider locally.  He thus presents today to have an EKG completed for monitoring of new medication. On exam, patient is in no acute distress, vital signs are stable, blood pressure normal, pulse normal. EKG completed today which is generally reassuring, normal rhythm, normal rate, normal QTc. Discussed with patient that EKG is generally reassuring, we will fax EKG results and office note to his cardiologist at [facility] for them to review and advise patient if any next steps are needed.   ___________________________________________ Leathia Farnell de [facility], MD, ABFM, CAQSM Primary Care and Sports Medicine [facility]

## 2022-01-08 ENCOUNTER — Other Ambulatory Visit (HOSPITAL_BASED_OUTPATIENT_CLINIC_OR_DEPARTMENT_OTHER): Payer: Self-pay | Admitting: Family Medicine

## 2022-03-05 DIAGNOSIS — H5203 Hypermetropia, bilateral: Secondary | ICD-10-CM | POA: Diagnosis not present

## 2022-03-20 ENCOUNTER — Ambulatory Visit (INDEPENDENT_AMBULATORY_CARE_PROVIDER_SITE_OTHER): Payer: BC Managed Care – PPO

## 2022-03-20 DIAGNOSIS — I422 Other hypertrophic cardiomyopathy: Secondary | ICD-10-CM | POA: Diagnosis not present

## 2022-03-20 LAB — CUP PACEART REMOTE DEVICE CHECK
Battery Remaining Longevity: 28 mo
Battery Remaining Percentage: 28 %
Battery Voltage: 2.86 V
Brady Statistic AP VP Percent: 1 %
Brady Statistic AP VS Percent: 1 %
Brady Statistic AS VP Percent: 1 %
Brady Statistic AS VS Percent: 99 %
Brady Statistic RA Percent Paced: 1 %
Brady Statistic RV Percent Paced: 1 %
Date Time Interrogation Session: 20231108020018
HighPow Impedance: 81 Ohm
HighPow Impedance: 81 Ohm
Implantable Lead Connection Status: 753985
Implantable Lead Connection Status: 753985
Implantable Lead Implant Date: 20150625
Implantable Lead Implant Date: 20150625
Implantable Lead Location: 753859
Implantable Lead Location: 753860
Implantable Pulse Generator Implant Date: 20150625
Lead Channel Impedance Value: 380 Ohm
Lead Channel Impedance Value: 460 Ohm
Lead Channel Pacing Threshold Amplitude: 0.75 V
Lead Channel Pacing Threshold Amplitude: 1 V
Lead Channel Pacing Threshold Pulse Width: 0.5 ms
Lead Channel Pacing Threshold Pulse Width: 0.6 ms
Lead Channel Sensing Intrinsic Amplitude: 12 mV
Lead Channel Sensing Intrinsic Amplitude: 3 mV
Lead Channel Setting Pacing Amplitude: 2 V
Lead Channel Setting Pacing Amplitude: 2.5 V
Lead Channel Setting Pacing Pulse Width: 0.5 ms
Lead Channel Setting Sensing Sensitivity: 0.5 mV
Pulse Gen Serial Number: 7199672
Zone Setting Status: 755011

## 2022-04-02 NOTE — Progress Notes (Signed)
Remote ICD transmission.   

## 2022-04-05 ENCOUNTER — Encounter (HOSPITAL_BASED_OUTPATIENT_CLINIC_OR_DEPARTMENT_OTHER): Payer: Self-pay | Admitting: Family Medicine

## 2022-04-08 ENCOUNTER — Other Ambulatory Visit (HOSPITAL_BASED_OUTPATIENT_CLINIC_OR_DEPARTMENT_OTHER): Payer: Self-pay

## 2022-04-08 DIAGNOSIS — K219 Gastro-esophageal reflux disease without esophagitis: Secondary | ICD-10-CM

## 2022-04-08 DIAGNOSIS — Y9389 Activity, other specified: Secondary | ICD-10-CM

## 2022-04-08 DIAGNOSIS — Z7689 Persons encountering health services in other specified circumstances: Secondary | ICD-10-CM

## 2022-04-08 MED ORDER — FLUOXETINE HCL 20 MG PO CAPS: 20.0000 mg | ORAL_CAPSULE | Freq: Every day | ORAL | 1 refills | Status: DC

## 2022-04-08 MED ORDER — VALACYCLOVIR HCL 500 MG PO TABS: 500.0000 mg | ORAL_TABLET | Freq: Every day | ORAL | 1 refills | Status: DC

## 2022-04-08 MED ORDER — METOPROLOL SUCCINATE ER 100 MG PO TB24: 100.0000 mg | ORAL_TABLET | Freq: Every day | ORAL | 1 refills | Status: DC

## 2022-06-19 ENCOUNTER — Ambulatory Visit (INDEPENDENT_AMBULATORY_CARE_PROVIDER_SITE_OTHER): Payer: BC Managed Care – PPO

## 2022-06-19 DIAGNOSIS — I422 Other hypertrophic cardiomyopathy: Secondary | ICD-10-CM

## 2022-06-19 LAB — CUP PACEART REMOTE DEVICE CHECK
Battery Remaining Longevity: 27 mo
Battery Remaining Percentage: 27 %
Battery Voltage: 2.83 V
Brady Statistic AP VP Percent: 1 %
Brady Statistic AP VS Percent: 1 %
Brady Statistic AS VP Percent: 1 %
Brady Statistic AS VS Percent: 99 %
Brady Statistic RA Percent Paced: 1 %
Brady Statistic RV Percent Paced: 1 %
Date Time Interrogation Session: 20240207024817
HighPow Impedance: 77 Ohm
HighPow Impedance: 77 Ohm
Implantable Lead Connection Status: 753985
Implantable Lead Connection Status: 753985
Implantable Lead Implant Date: 20150625
Implantable Lead Implant Date: 20150625
Implantable Lead Location: 753859
Implantable Lead Location: 753860
Implantable Pulse Generator Implant Date: 20150625
Lead Channel Impedance Value: 380 Ohm
Lead Channel Impedance Value: 450 Ohm
Lead Channel Pacing Threshold Amplitude: 0.75 V
Lead Channel Pacing Threshold Amplitude: 1 V
Lead Channel Pacing Threshold Pulse Width: 0.5 ms
Lead Channel Pacing Threshold Pulse Width: 0.6 ms
Lead Channel Sensing Intrinsic Amplitude: 12 mV
Lead Channel Sensing Intrinsic Amplitude: 2.8 mV
Lead Channel Setting Pacing Amplitude: 2 V
Lead Channel Setting Pacing Amplitude: 2.5 V
Lead Channel Setting Pacing Pulse Width: 0.5 ms
Lead Channel Setting Sensing Sensitivity: 0.5 mV
Pulse Gen Serial Number: 7199672
Zone Setting Status: 755011

## 2022-07-08 DIAGNOSIS — I421 Obstructive hypertrophic cardiomyopathy: Secondary | ICD-10-CM | POA: Diagnosis not present

## 2022-07-16 NOTE — Progress Notes (Signed)
Remote ICD transmission.   

## 2022-07-24 ENCOUNTER — Encounter (HOSPITAL_BASED_OUTPATIENT_CLINIC_OR_DEPARTMENT_OTHER): Payer: Self-pay

## 2022-07-29 DIAGNOSIS — I421 Obstructive hypertrophic cardiomyopathy: Secondary | ICD-10-CM | POA: Diagnosis not present

## 2022-09-18 ENCOUNTER — Ambulatory Visit (INDEPENDENT_AMBULATORY_CARE_PROVIDER_SITE_OTHER): Payer: BC Managed Care – PPO

## 2022-09-18 DIAGNOSIS — I422 Other hypertrophic cardiomyopathy: Secondary | ICD-10-CM

## 2022-09-23 LAB — CUP PACEART REMOTE DEVICE CHECK
Battery Remaining Longevity: 27 mo
Battery Remaining Percentage: 27 %
Battery Voltage: 2.83 V
Brady Statistic AP VP Percent: 1 %
Brady Statistic AP VS Percent: 1 %
Brady Statistic AS VP Percent: 1 %
Brady Statistic AS VS Percent: 99 %
Brady Statistic RA Percent Paced: 1 %
Brady Statistic RV Percent Paced: 1 %
Date Time Interrogation Session: 20240510162335
HighPow Impedance: 77 Ohm
HighPow Impedance: 77 Ohm
Implantable Lead Connection Status: 753985
Implantable Lead Connection Status: 753985
Implantable Lead Implant Date: 20150625
Implantable Lead Implant Date: 20150625
Implantable Lead Location: 753859
Implantable Lead Location: 753860
Implantable Pulse Generator Implant Date: 20150625
Lead Channel Impedance Value: 380 Ohm
Lead Channel Impedance Value: 460 Ohm
Lead Channel Pacing Threshold Amplitude: 0.75 V
Lead Channel Pacing Threshold Amplitude: 1 V
Lead Channel Pacing Threshold Pulse Width: 0.5 ms
Lead Channel Pacing Threshold Pulse Width: 0.6 ms
Lead Channel Sensing Intrinsic Amplitude: 12 mV
Lead Channel Sensing Intrinsic Amplitude: 2.8 mV
Lead Channel Setting Pacing Amplitude: 2 V
Lead Channel Setting Pacing Amplitude: 2.5 V
Lead Channel Setting Pacing Pulse Width: 0.5 ms
Lead Channel Setting Sensing Sensitivity: 0.5 mV
Pulse Gen Serial Number: 7199672
Zone Setting Status: 755011

## 2022-09-30 ENCOUNTER — Other Ambulatory Visit (HOSPITAL_BASED_OUTPATIENT_CLINIC_OR_DEPARTMENT_OTHER): Payer: Self-pay | Admitting: Family Medicine

## 2022-09-30 DIAGNOSIS — K219 Gastro-esophageal reflux disease without esophagitis: Secondary | ICD-10-CM

## 2022-10-09 NOTE — Progress Notes (Signed)
Remote ICD transmission.   

## 2022-11-26 ENCOUNTER — Encounter (HOSPITAL_BASED_OUTPATIENT_CLINIC_OR_DEPARTMENT_OTHER): Payer: Self-pay | Admitting: *Deleted

## 2022-11-26 ENCOUNTER — Telehealth (HOSPITAL_BASED_OUTPATIENT_CLINIC_OR_DEPARTMENT_OTHER): Payer: Self-pay | Admitting: *Deleted

## 2022-11-26 NOTE — Telephone Encounter (Signed)
 LVM to offer colon cancer screening. Mychart message sent

## 2022-12-02 DIAGNOSIS — I421 Obstructive hypertrophic cardiomyopathy: Secondary | ICD-10-CM | POA: Diagnosis not present

## 2022-12-26 ENCOUNTER — Other Ambulatory Visit (HOSPITAL_BASED_OUTPATIENT_CLINIC_OR_DEPARTMENT_OTHER): Payer: Self-pay | Admitting: Family Medicine

## 2022-12-26 DIAGNOSIS — Y9389 Activity, other specified: Secondary | ICD-10-CM

## 2023-01-14 ENCOUNTER — Ambulatory Visit (INDEPENDENT_AMBULATORY_CARE_PROVIDER_SITE_OTHER): Payer: BC Managed Care – PPO

## 2023-01-14 DIAGNOSIS — I422 Other hypertrophic cardiomyopathy: Secondary | ICD-10-CM

## 2023-01-14 LAB — CUP PACEART REMOTE DEVICE CHECK
Battery Remaining Longevity: 24 mo
Battery Remaining Percentage: 24 %
Battery Voltage: 2.8 V
Brady Statistic AP VP Percent: 1 %
Brady Statistic AP VS Percent: 1 %
Brady Statistic AS VP Percent: 1 %
Brady Statistic AS VS Percent: 99 %
Brady Statistic RA Percent Paced: 1 %
Brady Statistic RV Percent Paced: 1 %
Date Time Interrogation Session: 20240901150312
HighPow Impedance: 69 Ohm
HighPow Impedance: 69 Ohm
Implantable Lead Connection Status: 753985
Implantable Lead Connection Status: 753985
Implantable Lead Implant Date: 20150625
Implantable Lead Implant Date: 20150625
Implantable Lead Location: 753859
Implantable Lead Location: 753860
Implantable Pulse Generator Implant Date: 20150625
Lead Channel Impedance Value: 350 Ohm
Lead Channel Impedance Value: 410 Ohm
Lead Channel Pacing Threshold Amplitude: 0.75 V
Lead Channel Pacing Threshold Amplitude: 1 V
Lead Channel Pacing Threshold Pulse Width: 0.5 ms
Lead Channel Pacing Threshold Pulse Width: 0.6 ms
Lead Channel Sensing Intrinsic Amplitude: 11.4 mV
Lead Channel Sensing Intrinsic Amplitude: 3.1 mV
Lead Channel Setting Pacing Amplitude: 2 V
Lead Channel Setting Pacing Amplitude: 2.5 V
Lead Channel Setting Pacing Pulse Width: 0.5 ms
Lead Channel Setting Sensing Sensitivity: 0.5 mV
Pulse Gen Serial Number: 7199672
Zone Setting Status: 755011

## 2023-01-21 DIAGNOSIS — R55 Syncope and collapse: Secondary | ICD-10-CM | POA: Diagnosis not present

## 2023-01-21 DIAGNOSIS — I4729 Other ventricular tachycardia: Secondary | ICD-10-CM | POA: Diagnosis not present

## 2023-01-21 DIAGNOSIS — Z95810 Presence of automatic (implantable) cardiac defibrillator: Secondary | ICD-10-CM | POA: Diagnosis not present

## 2023-01-21 DIAGNOSIS — I421 Obstructive hypertrophic cardiomyopathy: Secondary | ICD-10-CM | POA: Diagnosis not present

## 2023-01-22 NOTE — Progress Notes (Signed)
Remote ICD transmission.   

## 2023-01-23 ENCOUNTER — Other Ambulatory Visit (HOSPITAL_BASED_OUTPATIENT_CLINIC_OR_DEPARTMENT_OTHER): Payer: Self-pay | Admitting: Family Medicine

## 2023-01-23 DIAGNOSIS — Z7689 Persons encountering health services in other specified circumstances: Secondary | ICD-10-CM

## 2023-03-20 ENCOUNTER — Other Ambulatory Visit (HOSPITAL_BASED_OUTPATIENT_CLINIC_OR_DEPARTMENT_OTHER): Payer: Self-pay | Admitting: Family Medicine

## 2023-03-20 DIAGNOSIS — Z7689 Persons encountering health services in other specified circumstances: Secondary | ICD-10-CM

## 2023-03-30 ENCOUNTER — Other Ambulatory Visit (HOSPITAL_BASED_OUTPATIENT_CLINIC_OR_DEPARTMENT_OTHER): Payer: Self-pay | Admitting: Family Medicine

## 2023-03-30 DIAGNOSIS — K219 Gastro-esophageal reflux disease without esophagitis: Secondary | ICD-10-CM

## 2023-04-06 ENCOUNTER — Other Ambulatory Visit (HOSPITAL_BASED_OUTPATIENT_CLINIC_OR_DEPARTMENT_OTHER): Payer: Self-pay | Admitting: Family Medicine

## 2023-04-06 DIAGNOSIS — Y9389 Activity, other specified: Secondary | ICD-10-CM

## 2023-06-18 ENCOUNTER — Ambulatory Visit: Payer: BC Managed Care – PPO

## 2023-06-18 DIAGNOSIS — I422 Other hypertrophic cardiomyopathy: Secondary | ICD-10-CM | POA: Diagnosis not present

## 2023-06-18 LAB — CUP PACEART REMOTE DEVICE CHECK
Battery Remaining Longevity: 20 mo
Battery Remaining Percentage: 20 %
Battery Voltage: 2.77 V
Brady Statistic AP VP Percent: 1 %
Brady Statistic AP VS Percent: 1 %
Brady Statistic AS VP Percent: 1 %
Brady Statistic AS VS Percent: 99 %
Brady Statistic RA Percent Paced: 1 %
Brady Statistic RV Percent Paced: 1 %
Date Time Interrogation Session: 20250205020016
HighPow Impedance: 75 Ohm
HighPow Impedance: 75 Ohm
Implantable Lead Connection Status: 753985
Implantable Lead Connection Status: 753985
Implantable Lead Implant Date: 20150625
Implantable Lead Implant Date: 20150625
Implantable Lead Location: 753859
Implantable Lead Location: 753860
Implantable Pulse Generator Implant Date: 20150625
Lead Channel Impedance Value: 360 Ohm
Lead Channel Impedance Value: 430 Ohm
Lead Channel Pacing Threshold Amplitude: 0.75 V
Lead Channel Pacing Threshold Amplitude: 0.75 V
Lead Channel Pacing Threshold Pulse Width: 0.5 ms
Lead Channel Pacing Threshold Pulse Width: 0.5 ms
Lead Channel Sensing Intrinsic Amplitude: 12 mV
Lead Channel Sensing Intrinsic Amplitude: 3.5 mV
Lead Channel Setting Pacing Amplitude: 2 V
Lead Channel Setting Pacing Amplitude: 2.5 V
Lead Channel Setting Pacing Pulse Width: 0.5 ms
Lead Channel Setting Sensing Sensitivity: 0.5 mV
Pulse Gen Serial Number: 7199672
Zone Setting Status: 755011

## 2023-07-25 NOTE — Progress Notes (Signed)
 Remote ICD transmission.

## 2023-10-01 ENCOUNTER — Other Ambulatory Visit (HOSPITAL_BASED_OUTPATIENT_CLINIC_OR_DEPARTMENT_OTHER): Payer: Self-pay | Admitting: Family Medicine

## 2023-10-01 DIAGNOSIS — K219 Gastro-esophageal reflux disease without esophagitis: Secondary | ICD-10-CM

## 2023-10-10 DIAGNOSIS — H5203 Hypermetropia, bilateral: Secondary | ICD-10-CM | POA: Diagnosis not present

## 2023-11-28 ENCOUNTER — Encounter (HOSPITAL_BASED_OUTPATIENT_CLINIC_OR_DEPARTMENT_OTHER): Payer: Self-pay | Admitting: Family Medicine

## 2023-11-28 ENCOUNTER — Other Ambulatory Visit (HOSPITAL_BASED_OUTPATIENT_CLINIC_OR_DEPARTMENT_OTHER): Payer: Self-pay | Admitting: Family Medicine

## 2023-11-28 DIAGNOSIS — K219 Gastro-esophageal reflux disease without esophagitis: Secondary | ICD-10-CM

## 2023-11-28 MED ORDER — FLUOXETINE HCL 20 MG PO CAPS: 20.0000 mg | ORAL_CAPSULE | Freq: Every day | ORAL | 1 refills | Status: DC

## 2023-11-28 NOTE — Telephone Encounter (Signed)
 Dr. [name] pt. Thersia, Please see mychart message sent by pt and advise.

## 2023-11-28 NOTE — Telephone Encounter (Signed)
 Please advise on refill request   [name] pt.

## 2023-12-08 DIAGNOSIS — I421 Obstructive hypertrophic cardiomyopathy: Secondary | ICD-10-CM | POA: Diagnosis not present

## 2023-12-24 ENCOUNTER — Other Ambulatory Visit (HOSPITAL_BASED_OUTPATIENT_CLINIC_OR_DEPARTMENT_OTHER): Payer: Self-pay | Admitting: Family Medicine

## 2023-12-24 DIAGNOSIS — K219 Gastro-esophageal reflux disease without esophagitis: Secondary | ICD-10-CM

## 2024-01-03 ENCOUNTER — Other Ambulatory Visit (HOSPITAL_BASED_OUTPATIENT_CLINIC_OR_DEPARTMENT_OTHER): Payer: Self-pay | Admitting: Family Medicine

## 2024-01-03 DIAGNOSIS — Y9389 Activity, other specified: Secondary | ICD-10-CM

## 2024-01-03 DIAGNOSIS — Z7689 Persons encountering health services in other specified circumstances: Secondary | ICD-10-CM

## 2024-01-08 ENCOUNTER — Ambulatory Visit (HOSPITAL_BASED_OUTPATIENT_CLINIC_OR_DEPARTMENT_OTHER): Admitting: Family Medicine

## 2024-01-08 ENCOUNTER — Encounter (HOSPITAL_BASED_OUTPATIENT_CLINIC_OR_DEPARTMENT_OTHER): Payer: Self-pay | Admitting: Family Medicine

## 2024-01-08 VITALS — BP 100/79 | HR 83 | Ht 73.0 in | Wt 189.4 lb

## 2024-01-08 DIAGNOSIS — E782 Mixed hyperlipidemia: Secondary | ICD-10-CM | POA: Insufficient documentation

## 2024-01-08 DIAGNOSIS — F32A Depression, unspecified: Secondary | ICD-10-CM

## 2024-01-08 DIAGNOSIS — F419 Anxiety disorder, unspecified: Secondary | ICD-10-CM | POA: Insufficient documentation

## 2024-01-08 DIAGNOSIS — F3341 Major depressive disorder, recurrent, in partial remission: Secondary | ICD-10-CM | POA: Insufficient documentation

## 2024-01-08 DIAGNOSIS — Z0000 Encounter for general adult medical examination without abnormal findings: Secondary | ICD-10-CM

## 2024-01-08 DIAGNOSIS — B001 Herpesviral vesicular dermatitis: Secondary | ICD-10-CM | POA: Insufficient documentation

## 2024-01-08 DIAGNOSIS — Z1211 Encounter for screening for malignant neoplasm of colon: Secondary | ICD-10-CM | POA: Diagnosis not present

## 2024-01-08 MED ORDER — FLUOXETINE HCL 20 MG PO CAPS: 20.0000 mg | ORAL_CAPSULE | Freq: Every day | ORAL | 1 refills | Status: AC

## 2024-01-08 MED ORDER — ALPRAZOLAM 0.5 MG PO TABS: 0.5000 mg | ORAL_TABLET | Freq: Every day | ORAL | 0 refills | Status: AC | PRN

## 2024-01-08 MED ORDER — VALACYCLOVIR HCL 500 MG PO TABS: 500.0000 mg | ORAL_TABLET | Freq: Every day | ORAL | 1 refills | Status: AC

## 2024-01-08 MED ORDER — BUPROPION HCL ER (XL) 150 MG PO TB24: 150.0000 mg | ORAL_TABLET | Freq: Every day | ORAL | 1 refills | Status: AC

## 2024-01-08 NOTE — Patient Instructions (Signed)
  Medication Instructions:  Your physician recommends that you continue on your current medications as directed. Please refer to the Current Medication list given to you today. --If you need a refill on any your medications before your next appointment, please call your pharmacy first. If no refills are authorized on file call the office.-- Lab Work: Your physician has recommended that you have lab work today: 1 week before next visit  If you have labs (blood work) drawn today and your tests are completely normal, you will receive your results via MyChart message OR a phone call from our staff.  Please ensure you check your voicemail in the event that you authorized detailed messages to be left on a delegated number. If you have any lab test that is abnormal or we need to change your treatment, we will call you to review the results.  Follow-Up: Your next appointment:   Your physician recommends that you schedule a follow-up appointment in: 2-3 months physical with Dr. [name]  You will receive a text message or e-mail with a link to a survey about your care and experience with [place] today! We would greatly appreciate your feedback!   Thanks for letting us be apart of your health journey!!  Primary Care and Sports Medicine   Dr. Ceasar Mons [place]   We encourage you to activate your patient portal called "MyChart".  Sign up information is provided on this After Visit Summary.  MyChart is used to connect with patients for Virtual Visits (Telemedicine).  Patients are able to view lab/test results, encounter notes, upcoming appointments, etc.  Non-urgent messages can be sent to your provider as well. To learn more about what you can do with MyChart, please visit --  [email or web address].

## 2024-01-08 NOTE — Progress Notes (Signed)
    Procedures performed today:    None.  Independent interpretation of notes and tests performed by another provider:   None.  Brief History, Exam, Impression, and Recommendations:    BP 100/79 (BP Location: Right Arm, Patient Position: Sitting, Cuff Size: Normal)   Pulse 83   Ht 6' 1 (1.854 m)   Wt 189 lb 6.4 oz (85.9 kg)   SpO2 97%   BMI 24.99 kg/m   Anxiety and depression Assessment & Plan: Has been doing well with management with Wellbutrin  and Prozac , requesting refill today.  Utilizes alprazolam  very infrequently.  PDMP reviewed today, no red flags.  Has not had any prescriptions for alprazolam  filled within the past 2 years. Patient stable on current regimen, will refill medications  Orders: -     buPROPion  HCl ER (XL); Take 1 tablet (150 mg total) by mouth daily with lunch.  Dispense: 90 tablet; Refill: 1 -     FLUoxetine  HCl; Take 1 capsule (20 mg total) by mouth daily.  Dispense: 90 capsule; Refill: 1  Special screening for malignant neoplasms, colon -     Ambulatory referral to Gastroenterology  Wellness examination -     CBC with Differential/Platelet; Future -     Comprehensive metabolic panel with GFR; Future -     Hemoglobin A1c; Future -     Lipid panel; Future -     TSH Rfx on Abnormal to Free T4; Future  Other orders -     valACYclovir  HCl; Take 1 tablet (500 mg total) by mouth daily.  Dispense: 90 tablet; Refill: 1 -     ALPRAZolam ; Take 1 tablet (0.5 mg total) by mouth daily as needed for anxiety.  Dispense: 15 tablet; Refill: 0  Return in about 3 months (around 04/09/2024) for CPE with fasting labs 1 week prior.   ___________________________________________ David Sweeden [name], MD, ABFM, CAQSM Primary Care and Sports Medicine [facility]

## 2024-01-08 NOTE — Assessment & Plan Note (Addendum)
 Has been doing well with management with Wellbutrin  and Prozac , requesting refill today.  Utilizes alprazolam  very infrequently.  PDMP reviewed today, no red flags.  Has not had any prescriptions for alprazolam  filled within the past 2 years. Patient stable on current regimen, will refill medications

## 2024-01-23 DIAGNOSIS — I421 Obstructive hypertrophic cardiomyopathy: Secondary | ICD-10-CM | POA: Diagnosis not present

## 2024-01-23 DIAGNOSIS — R55 Syncope and collapse: Secondary | ICD-10-CM | POA: Diagnosis not present

## 2024-01-23 DIAGNOSIS — Z95810 Presence of automatic (implantable) cardiac defibrillator: Secondary | ICD-10-CM | POA: Diagnosis not present

## 2024-01-23 DIAGNOSIS — I4729 Other ventricular tachycardia: Secondary | ICD-10-CM | POA: Diagnosis not present
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
## 2024-04-13 ENCOUNTER — Encounter (HOSPITAL_BASED_OUTPATIENT_CLINIC_OR_DEPARTMENT_OTHER): Payer: Self-pay | Admitting: Family Medicine

## 2024-04-13 ENCOUNTER — Ambulatory Visit (INDEPENDENT_AMBULATORY_CARE_PROVIDER_SITE_OTHER): Admitting: Family Medicine

## 2024-04-13 VITALS — BP 118/89 | HR 81 | Temp 98.3°F | Resp 18 | Ht 73.0 in | Wt 182.0 lb

## 2024-04-13 DIAGNOSIS — Z0000 Encounter for general adult medical examination without abnormal findings: Secondary | ICD-10-CM | POA: Insufficient documentation

## 2024-04-13 NOTE — Progress Notes (Signed)
 Subjective:    CC: Annual Physical Exam  HPI: David Booker is a 48 y.o. presenting for annual physical  I reviewed the past medical history, family history, social history, surgical history, and allergies today and no changes were needed.  Please see the problem list section below in epic for further details.  Past Medical History: Past Medical History:  Diagnosis Date   Abnormal heart rhythm    Allergic rhinitis, cause unspecified    Anxiety    Back pain    without radiation   Bradycardia    Noted on event monitor (04/20/12-05/20/12) Just before bradycardia was recorded, HR was approx. 130bpm   Depressive disorder, not elsewhere classified    Esophageal reflux    Chronic but his current pain appears to be different than his radiating to the back. Possible biliary disease. (04/22/13 Dr. Lynwood Bohr, GI)   Genital HSV    Heartburn    Long Hx   Near syncope    While wearing event monitor (04/20/12-05/20/12) 46bpm. Also had chest tightness at this time. Possible vagal Cecile 05/22/12)   Other specified circulatory system disorders    Palpitations    PVC (premature ventricular contraction)    Noted on event monitor (04/20/12-05/20/12)    Substernal chest pain 04/22/13   Pt reports having vague symptoms of substernal CP, different from heartburn in that it is dull & radiatesto the back. Occurring at least 3 episodes over past several weeks. Pain free in between episodes. (04/22/13 Dr. Lynwood Bohr GI)    Syncope    Syncope and collapse    Tachycardia, unspecified    Viral syndrome    Wide QRS ventricular tachycardia (HCC)    Noted on event monitor (04/20/12-05/20/12) at night   Past Surgical History: Past Surgical History:  Procedure Laterality Date   CARDIAC DEFIBRILLATOR PLACEMENT  11/04/13   SJM Fortify Assura DR implanted by Dr Kelsie for hypertrophic CM and syncope   IMPLANTABLE CARDIOVERTER DEFIBRILLATOR IMPLANT N/A 11/04/2013   Procedure: IMPLANTABLE  CARDIOVERTER DEFIBRILLATOR IMPLANT;  Surgeon: Lynwood JONETTA Kelsie, MD;  Location: [facility] CATH LAB;  Service: Cardiovascular;  Laterality: N/A;   SHOULDER SURGERY Right    Dislocated   Social History: Social History   Socioeconomic History   Marital status: Married    Spouse name: Not on file   Number of children: Not on file   Years of education: Not on file   Highest education level: Not on file  Occupational History   Not on file  Tobacco Use   Smoking status: Never    Passive exposure: Never   Smokeless tobacco: Never  Vaping Use   Vaping status: Never Used  Substance and Sexual Activity   Alcohol use: Yes    Comment: 1-2 mixed drinks per day   Drug use: No   Sexual activity: Yes  Other Topics Concern   Not on file  Social History Narrative   Lives in [place].  Owns a teaching laboratory technician.  Married.  No children   Social Drivers of Corporate Investment Banker Strain: Not on file  Food Insecurity: Not on file  Transportation Needs: Not on file  Physical Activity: Not on file  Stress: Not on file  Social Connections: Not on file   Family History: Family History  Problem Relation Age of Onset   CAD Paternal Uncle        paternal uncle died suddenly, per pt autopsy confirmed MI   Breast cancer Mother 33   Hiatal hernia Father  Cardiomyopathy Father        Hypertrophic cardiomyopathy HOCM   Anxiety disorder Sister    Heart disease Paternal Uncle        Has a VAD and IDC   Cardiomyopathy Paternal Uncle        Hypertrophic cardiomyopathy HOCM   Diabetes Paternal Grandmother        Hypertrophic cardiomyopathy   Diabetes Paternal Grandfather    Cardiomyopathy Paternal Grandfather        Hypertrophic cardiomyopathy HOCM   Heart murmur Sister    GER disease Father    Supraventricular tachycardia Father    Heart attack Paternal Uncle 80   Diabetes Paternal Uncle    Cardiomyopathy Cousin        hypertrophic cardiomyopathy   Heart disease Cousin        mitral valve repair and  heart ablation surgery   Macular degeneration Maternal Grandmother    Scoliosis Mother    Osteoporosis Mother    Marfan syndrome Maternal Aunt    Scoliosis Maternal Aunt    Mitral valve prolapse Maternal Aunt    Mitral valve prolapse Maternal Uncle    Other Maternal Uncle        connective tissue issues   Mitral valve prolapse Cousin    Other Cousin        thoracic output surgery   Other Cousin        thoracic output surgery   Allergies: No Known Allergies Medications: See med rec.  Review of Systems: No headache, visual changes, nausea, vomiting, diarrhea, constipation, dizziness, abdominal pain, skin rash, fevers, chills, night sweats, swollen lymph nodes, weight loss, chest pain, body aches, joint swelling, muscle aches, shortness of breath, mood changes, visual or auditory hallucinations.  Objective:    BP 118/89 (BP Location: Left Arm, Patient Position: Sitting, Cuff Size: Normal)   Pulse 81   Temp 98.3 F (36.8 C) (Oral)   Resp 18   Ht 6' 1 (1.854 m)   Wt 182 lb (82.6 kg)   SpO2 100%   BMI 24.01 kg/m   General: Well Developed, well nourished, and in no acute distress. Neuro: Alert and oriented x3, extra-ocular muscles intact, sensation grossly intact. Cranial nerves II through XII are intact, motor, sensory, and coordinative functions are all intact. HEENT: Normocephalic, atraumatic, pupils equal round reactive to light, neck supple, no masses, no lymphadenopathy, thyroid  nonpalpable. Oropharynx, nasopharynx, external ear canals are unremarkable. Skin: Warm and dry, no rashes noted. Cardiac: Regular rate and rhythm, no murmurs rubs or gallops.  Respiratory: Clear to auscultation bilaterally. Not using accessory muscles, speaking in full sentences.  Abdominal: Soft, nontender, nondistended, positive bowel sounds, no masses, no organomegaly.  Musculoskeletal: Shoulder, elbow, wrist, hip, knee, ankle stable, and with full range of motion.  Impression and  Recommendations:    Wellness examination Assessment & Plan: Routine HCM labs ordered. HCM reviewed/discussed. Anticipatory guidance regarding healthy weight, lifestyle and choices given. Recommend healthy diet.  Recommend approximately 150 minutes/week of moderate intensity exercise Recommend regular dental and vision exams Always use seatbelt/lap and shoulder restraints Recommend using smoke alarms and checking batteries at least twice a year Recommend using sunscreen when outside Discussed colon cancer screening recommendations, options.  Patient does need to schedule this Discussed recommendations for shingles vaccine. Discussed immunization recommendations.   Return in about 4 months (around 08/12/2024) for med check.   ___________________________________________ Yukiko Minnich de [place], MD, ABFM, [facility]

## 2024-04-13 NOTE — Assessment & Plan Note (Signed)
 Routine HCM labs ordered. HCM reviewed/discussed. Anticipatory guidance regarding healthy weight, lifestyle and choices given. Recommend healthy diet.  Recommend approximately 150 minutes/week of moderate intensity exercise Recommend regular dental and vision exams Always use seatbelt/lap and shoulder restraints Recommend using smoke alarms and checking batteries at least twice a year Recommend using sunscreen when outside Discussed colon cancer screening recommendations, options.  Patient does need to schedule this Discussed recommendations for shingles vaccine. Discussed immunization recommendations.
